# Patient Record
Sex: Female | Born: 1975 | Race: White | Hispanic: No | Marital: Single | Smoking: Current every day smoker
Health system: Southern US, Community
[De-identification: ages and names within clinical notes are randomized; demographics above are authoritative.]

## PROBLEM LIST (undated history)

## (undated) ENCOUNTER — Emergency Department (HOSPITAL_COMMUNITY): Payer: Medicaid Other

## (undated) DIAGNOSIS — M549 Dorsalgia, unspecified: Secondary | ICD-10-CM

## (undated) DIAGNOSIS — F32A Depression, unspecified: Secondary | ICD-10-CM

## (undated) DIAGNOSIS — F329 Major depressive disorder, single episode, unspecified: Secondary | ICD-10-CM

## (undated) DIAGNOSIS — M419 Scoliosis, unspecified: Secondary | ICD-10-CM

## (undated) HISTORY — PX: BACK SURGERY: SHX140

## (undated) HISTORY — PX: CHOLECYSTECTOMY: SHX55

---

## 2010-01-30 ENCOUNTER — Emergency Department (HOSPITAL_BASED_OUTPATIENT_CLINIC_OR_DEPARTMENT_OTHER): Admission: EM | Admit: 2010-01-30 | Discharge: 2010-01-30 | Payer: Self-pay | Admitting: Emergency Medicine

## 2010-06-03 LAB — URINALYSIS, ROUTINE W REFLEX MICROSCOPIC
Glucose, UA: NEGATIVE mg/dL
Ketones, ur: NEGATIVE mg/dL
Protein, ur: NEGATIVE mg/dL
pH: 6.5 (ref 5.0–8.0)

## 2010-06-03 LAB — URINE MICROSCOPIC-ADD ON

## 2011-03-07 ENCOUNTER — Encounter: Payer: Self-pay | Admitting: *Deleted

## 2011-03-07 ENCOUNTER — Emergency Department (HOSPITAL_BASED_OUTPATIENT_CLINIC_OR_DEPARTMENT_OTHER)
Admission: EM | Admit: 2011-03-07 | Discharge: 2011-03-07 | Disposition: A | Discharge status: Home / Self Care | Payer: Self-pay | Attending: Emergency Medicine | Admitting: Emergency Medicine

## 2011-03-07 ENCOUNTER — Emergency Department (INDEPENDENT_AMBULATORY_CARE_PROVIDER_SITE_OTHER): Payer: Self-pay

## 2011-03-07 DIAGNOSIS — R109 Unspecified abdominal pain: Secondary | ICD-10-CM | POA: Insufficient documentation

## 2011-03-07 DIAGNOSIS — R197 Diarrhea, unspecified: Secondary | ICD-10-CM

## 2011-03-07 DIAGNOSIS — R112 Nausea with vomiting, unspecified: Secondary | ICD-10-CM

## 2011-03-07 LAB — URINALYSIS, ROUTINE W REFLEX MICROSCOPIC
Glucose, UA: NEGATIVE mg/dL
Hgb urine dipstick: NEGATIVE
Leukocytes, UA: NEGATIVE
Protein, ur: NEGATIVE mg/dL
Specific Gravity, Urine: 1.006 (ref 1.005–1.030)
Urobilinogen, UA: 0.2 mg/dL (ref 0.0–1.0)

## 2011-03-07 LAB — PREGNANCY, URINE: Preg Test, Ur: NEGATIVE

## 2011-03-07 LAB — WET PREP, GENITAL: Trich, Wet Prep: NONE SEEN

## 2011-03-07 IMAGING — CR DG ABDOMEN ACUTE W/ 1V CHEST
3 series · 3 of 3 positions shown · non-contrast
Comparison: None

CLINICAL DATA: Nausea, vomiting and diarrhea.

ACUTE ABDOMEN SERIES (ABDOMEN 2 VIEW & CHEST 1 VIEW)

[t abdomen supine]
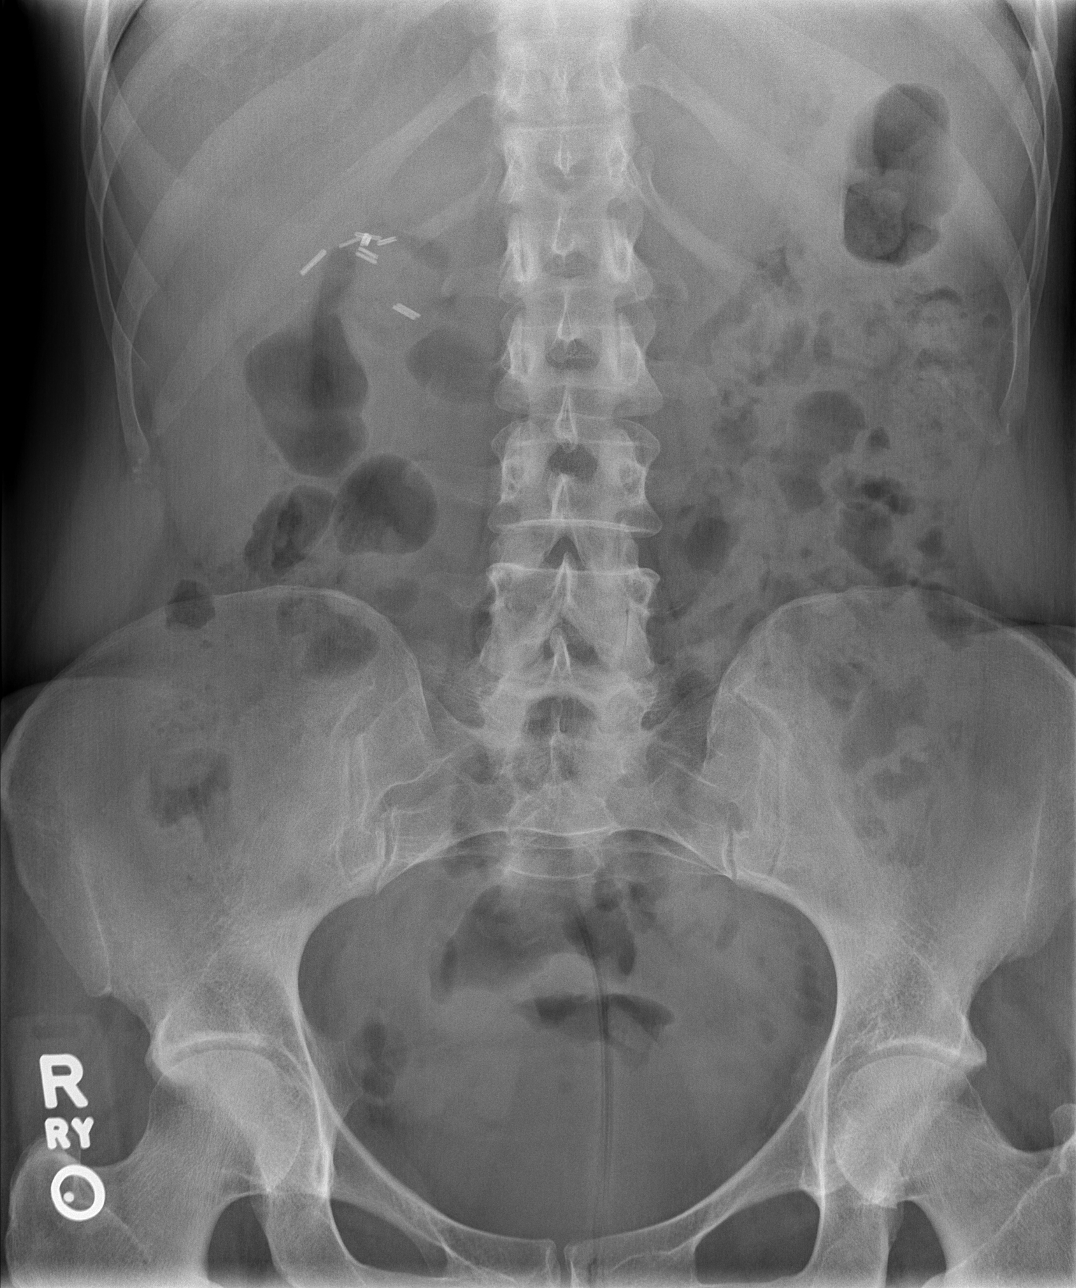

[w abdomen upright]
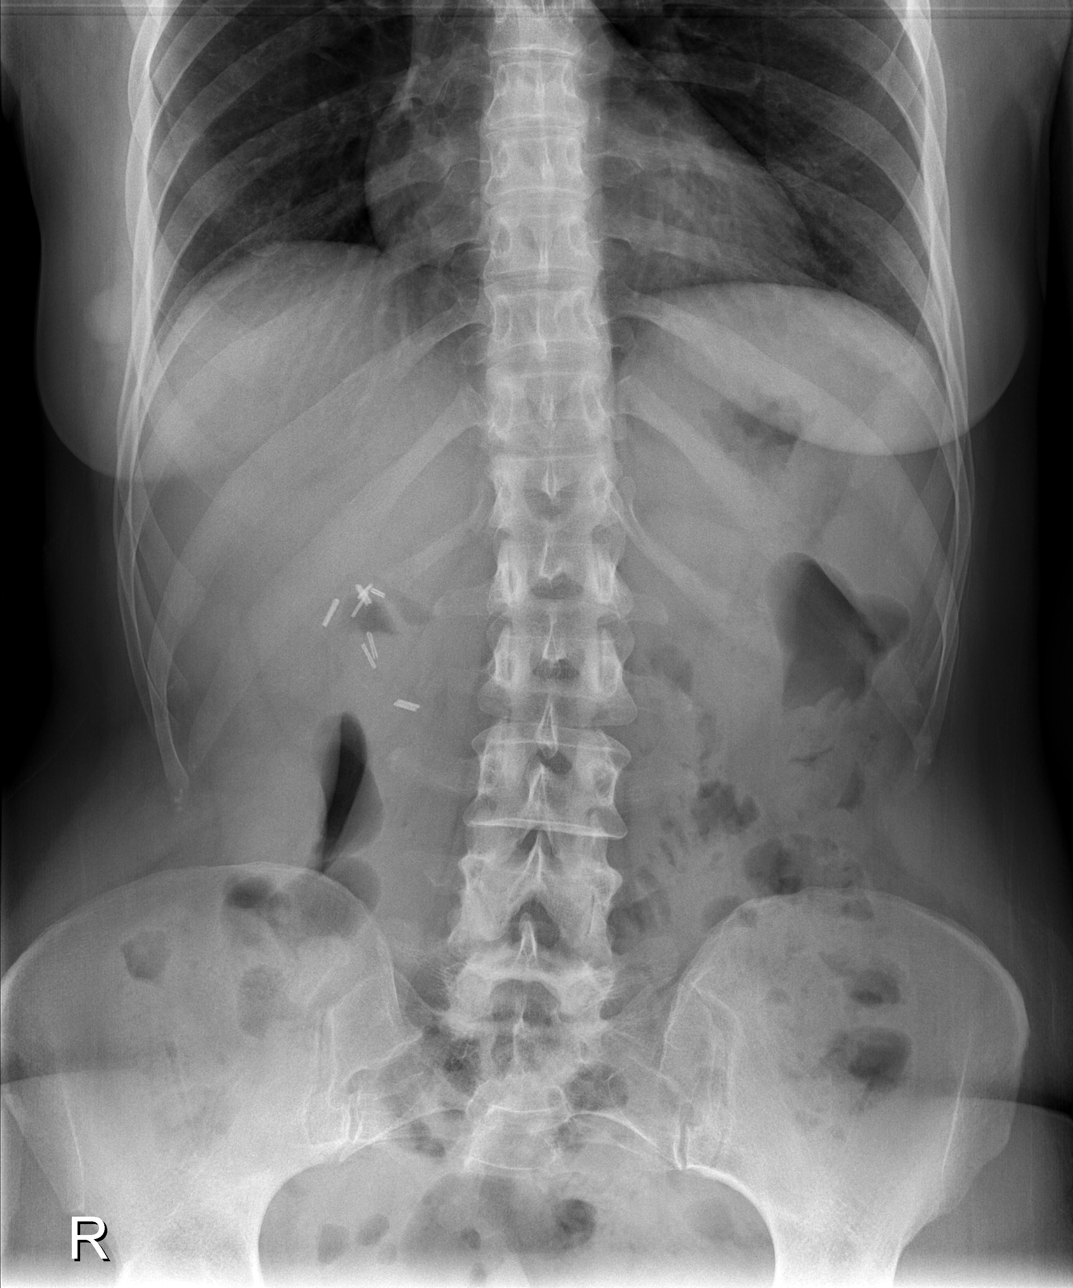

[w chest pa]
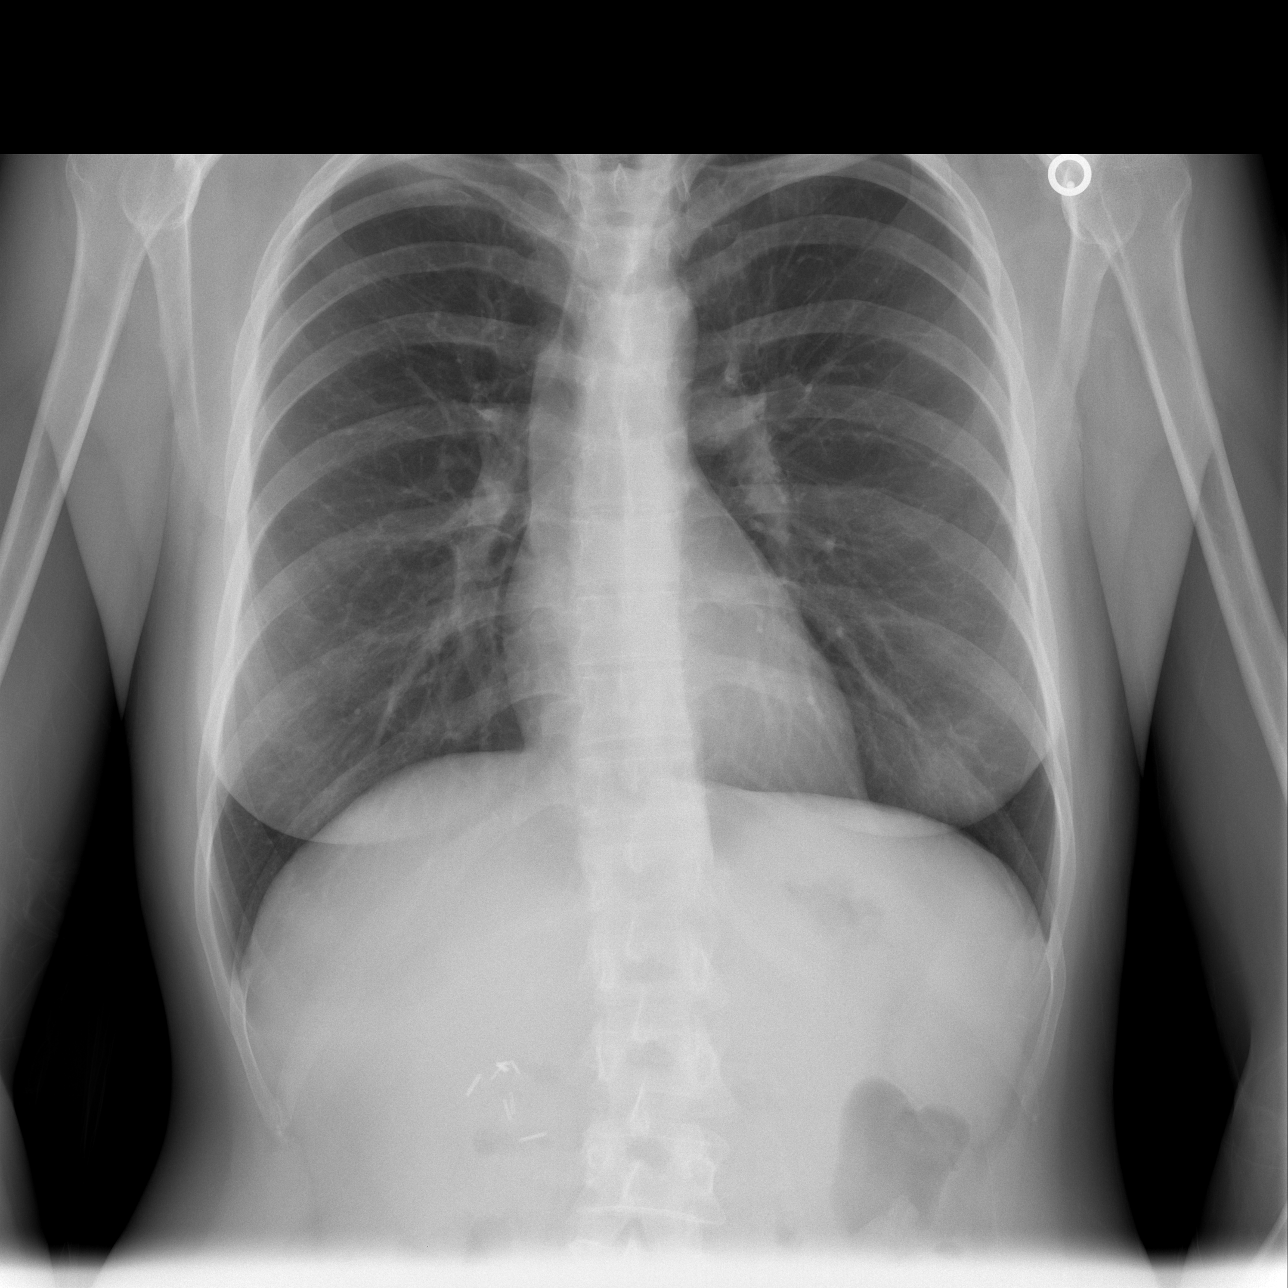

[3 of 3 positions shown; findings below may reference images not displayed]

FINDINGS: The upright chest x-ray is normal.

Two views of the abdomen demonstrate an unremarkable bowel gas
pattern.  No findings for obstruction or perforation.  Surgical
changes in the right upper quadrant from a prior cholecystectomy.
The soft tissue shadows are maintained.  The bony structures are
intact.
IMPRESSION: 1.  No acute cardiopulmonary findings.
2.  No plain film findings for an acute abdominal process.
Nonspecific bowel gas pattern.

## 2011-03-07 MED ORDER — HYDROCODONE-ACETAMINOPHEN 5-500 MG PO TABS: 1.0000 | ORAL_TABLET | Freq: Four times a day (QID) | ORAL | Status: AC | PRN

## 2011-03-07 MED ORDER — DICYCLOMINE HCL 10 MG/ML IM SOLN
20.0000 mg | Freq: Once | INTRAMUSCULAR | Status: AC
  Administered 2011-03-07: 20 mg via INTRAMUSCULAR
  Filled 2011-03-07: qty 2

## 2011-03-07 NOTE — ED Notes (Signed)
Pt states she had a "nightmare surgery" in Sept. When her GB was removed. "They knicked my liver and I have not been right since". Now c/o right side pain around ribs and low abd "burning" x 2 weeks.  Some recurrent nausea and diarrhea.

## 2011-03-07 NOTE — ED Provider Notes (Signed)
Medical screening examination/treatment/procedure(s) were performed by non-physician practitioner and as supervising physician I was immediately available for consultation/collaboration.  Hurman Horn, MD 03/07/11 [phone]

## 2011-03-07 NOTE — ED Provider Notes (Signed)
History     CSN: [id number] Arrival date & time: 03/07/2011  3:20 PM   First MD Initiated Contact with Patient 03/07/11 1523      Chief Complaint  Patient presents with  . Abdominal Pain    (Consider location/radiation/quality/duration/timing/severity/associated sxs/prior treatment) HPI Comments: Pt states that he has a history of a bad surgery last year and she has been having problems since:pt states that she recently got off all her medications but then she developed and burning sensation in her lower abdomen and diarrhea:pt states that she has had 2-3 episodes of diarrhea daily:no recent travel or abx  Patient is a 35 y.o. female presenting with abdominal pain. The history is provided by the patient. No language interpreter was used.  Abdominal Pain The primary symptoms of the illness include abdominal pain, diarrhea, dysuria and vaginal discharge. The primary symptoms of the illness do not include vomiting. The current episode started more than 2 days ago. The onset of the illness was gradual. The problem has not changed since onset. The vaginal discharge is associated with dysuria.   The patient states that she believes she is currently not pregnant. The patient has not had a change in bowel habit.    History reviewed. No pertinent past medical history.  Past Surgical History  Procedure Date  . Cholecystectomy     History reviewed. No pertinent family history.  History  Substance Use Topics  . Smoking status: Current Everyday Smoker  . Smokeless tobacco: Not on file  . Alcohol Use: No    OB History    Grav Para Term Preterm Abortions TAB SAB Ect Mult Living                  Review of Systems  Gastrointestinal: Positive for abdominal pain and diarrhea. Negative for vomiting.  Genitourinary: Positive for dysuria and vaginal discharge.  All other systems reviewed and are negative.    Allergies  Penicillins and Rocephin  Home Medications   Current  Outpatient Rx  Name Route Sig Dispense Refill  . PROMETHAZINE HCL 25 MG PO TABS Oral Take 25 mg by mouth every 6 (six) hours as needed. For nausea        BP 115/80  Pulse 100  Temp(Src) 97.9 F (36.6 C) (Oral)  Resp 20  Ht 5\' 3"  (1.6 m)  Wt 130 lb (58.968 kg)  BMI 23.03 kg/m2  SpO2 100%  LMP 03/02/2011  Physical Exam  Nursing note and vitals reviewed. Constitutional: She is oriented to person, place, and time. She appears well-developed and well-nourished.  HENT:  Head: Normocephalic.  Cardiovascular: Normal rate and regular rhythm.   Pulmonary/Chest: Effort normal and breath sounds normal.  Abdominal: Soft. Bowel sounds are normal.       Pt has tenderness noted on exam  Genitourinary: Cervix exhibits no motion tenderness.  Musculoskeletal: Normal range of motion.  Neurological: She is alert and oriented to person, place, and time.  Skin: Skin is warm and dry.  Psychiatric: She has a normal mood and affect.    ED Course  Procedures (including critical care time)  Labs Reviewed  WET PREP, GENITAL - Abnormal; Notable for the following:    Clue Cells, Wet Prep FEW (*)    WBC, Wet Prep HPF POC FEW (*)    All other components within normal limits  URINALYSIS, ROUTINE W REFLEX MICROSCOPIC  PREGNANCY, URINE  GC/CHLAMYDIA PROBE AMP, GENITAL   Dg Abd Acute W/chest  03/07/2011  *RADIOLOGY REPORT*  Clinical  Data: Nausea, vomiting and diarrhea.  ACUTE ABDOMEN SERIES (ABDOMEN 2 VIEW & CHEST 1 VIEW)  Comparison: None  Findings: The upright chest x-ray is normal.  Two views of the abdomen demonstrate an unremarkable bowel gas pattern.  No findings for obstruction or perforation.  Surgical changes in the right upper quadrant from a prior cholecystectomy. The soft tissue shadows are maintained.  The bony structures are intact.  IMPRESSION:  1.  No acute cardiopulmonary findings. 2.  No plain film findings for an acute abdominal process. Nonspecific bowel gas pattern.  Original Report  Authenticated By: P. Loralie Champagne, M.D.     1. Abdominal pain   2. Diarrhea       MDM  abd non surgical on exam:pt is having problem with food tolerance:discussed with pt that with having had the symptoms this long it is probably worth follow up with gi        Teressa Lower, NP 03/07/11 1715

## 2011-03-09 LAB — GC/CHLAMYDIA PROBE AMP, GENITAL: GC Probe Amp, Genital: NEGATIVE

## 2012-12-31 ENCOUNTER — Emergency Department (HOSPITAL_BASED_OUTPATIENT_CLINIC_OR_DEPARTMENT_OTHER)
Admission: EM | Admit: 2012-12-31 | Discharge: 2012-12-31 | Disposition: A | Discharge status: Home / Self Care | Payer: Self-pay | Attending: Emergency Medicine | Admitting: Emergency Medicine

## 2012-12-31 ENCOUNTER — Encounter (HOSPITAL_BASED_OUTPATIENT_CLINIC_OR_DEPARTMENT_OTHER): Payer: Self-pay | Admitting: Emergency Medicine

## 2012-12-31 DIAGNOSIS — F17200 Nicotine dependence, unspecified, uncomplicated: Secondary | ICD-10-CM | POA: Insufficient documentation

## 2012-12-31 DIAGNOSIS — Y939 Activity, unspecified: Secondary | ICD-10-CM | POA: Insufficient documentation

## 2012-12-31 DIAGNOSIS — Y929 Unspecified place or not applicable: Secondary | ICD-10-CM | POA: Insufficient documentation

## 2012-12-31 DIAGNOSIS — Z880 Allergy status to penicillin: Secondary | ICD-10-CM | POA: Insufficient documentation

## 2012-12-31 DIAGNOSIS — L02214 Cutaneous abscess of groin: Secondary | ICD-10-CM

## 2012-12-31 DIAGNOSIS — L089 Local infection of the skin and subcutaneous tissue, unspecified: Secondary | ICD-10-CM | POA: Insufficient documentation

## 2012-12-31 DIAGNOSIS — L02219 Cutaneous abscess of trunk, unspecified: Secondary | ICD-10-CM | POA: Insufficient documentation

## 2012-12-31 NOTE — ED Provider Notes (Signed)
CSN: [id number]     Arrival date & time 12/31/12  0740 History   First MD Initiated Contact with Patient 12/31/12 [phone]     Chief Complaint  Patient presents with  . Insect Bite   (Consider location/radiation/quality/duration/timing/severity/associated sxs/prior Treatment) HPI Pt reprots she woke up this morning with itching all over and a sharp pain in her R inguinal area. She found a large brown spider in her bed and is concerned about brown recluse bite. Denies fever or drainage.   No past medical history on file. Past Surgical History  Procedure Laterality Date  . Cholecystectomy     No family history on file. History  Substance Use Topics  . Smoking status: Current Every Day Smoker  . Smokeless tobacco: Not on file  . Alcohol Use: No   OB History   Grav Para Term Preterm Abortions TAB SAB Ect Mult Living                 Review of Systems All other systems reviewed and are negative except as noted in HPI.   Allergies  Penicillins and Rocephin  Home Medications  No current outpatient prescriptions on file. BP 110/72  Pulse 100  Temp(Src) 98.6 F (37 C) (Oral)  Resp 16  Ht 5\' 3"  (1.6 m)  Wt 144 lb (65.318 kg)  BMI 25.51 kg/m2  SpO2 98%  LMP 12/10/2012 Physical Exam  Nursing note and vitals reviewed. Constitutional: She is oriented to person, place, and time. She appears well-developed and well-nourished.  HENT:  Head: Normocephalic and atraumatic.  Eyes: EOM are normal. Pupils are equal, round, and reactive to light.  Neck: Normal range of motion. Neck supple.  Cardiovascular: Normal rate, normal heart sounds and intact distal pulses.   Pulmonary/Chest: Effort normal and breath sounds normal.  Abdominal: Bowel sounds are normal. She exhibits no distension. There is no tenderness.  Genitourinary:  Small 1cm area of fluctuance and tenderness with central ecchymosis in R inguinal area  Musculoskeletal: Normal range of motion. She exhibits no edema and no  tenderness.  Neurological: She is alert and oriented to person, place, and time. She has normal strength. No cranial nerve deficit or sensory deficit.  Skin: Skin is warm and dry. No rash noted.  Psychiatric: She has a normal mood and affect.    ED Course  Procedures (including critical care time) INCISION AND DRAINAGE Performed by: Pollyann Savoy. Consent: Verbal consent obtained. Risks and benefits: risks, benefits and alternatives were discussed Time out performed prior to procedure Type: abscess Body area: R groin Anesthesia: local infiltration Incision was made with a scalpel. Local anesthetic: lidocaine 1% no epinephrine Anesthetic total: 1 ml Complexity: simple Drainage: purulent Drainage amount: small Packing material: none Patient tolerance: Patient tolerated the procedure well with no immediate complications.     Labs Review Labs Reviewed - No data to display Imaging Review No results found.  EKG Interpretation   None       MDM   1. Abscess of groin, right     Suspect small abscess from ingrown hair as opposed to brown recluse bite. Pt and daughter describe a brown spider with black spots much larger than typical for brown recluse.     Tambi Thole B. Bernette Mayers, MD 12/31/12 [phone]

## 2012-12-31 NOTE — ED Notes (Signed)
MD at bedside. 

## 2012-12-31 NOTE — ED Notes (Signed)
Pt states she was bitten by brown recluse this am on right groin.  Pt states it is painful, itching.

## 2013-07-02 ENCOUNTER — Encounter (HOSPITAL_BASED_OUTPATIENT_CLINIC_OR_DEPARTMENT_OTHER): Payer: Self-pay | Admitting: Emergency Medicine

## 2013-07-02 ENCOUNTER — Emergency Department (HOSPITAL_BASED_OUTPATIENT_CLINIC_OR_DEPARTMENT_OTHER)
Admission: EM | Admit: 2013-07-02 | Discharge: 2013-07-02 | Disposition: A | Discharge status: Home / Self Care | Payer: Worker's Compensation | Attending: Emergency Medicine | Admitting: Emergency Medicine

## 2013-07-02 DIAGNOSIS — Y9389 Activity, other specified: Secondary | ICD-10-CM | POA: Insufficient documentation

## 2013-07-02 DIAGNOSIS — T23279A Burn of second degree of unspecified wrist, initial encounter: Secondary | ICD-10-CM | POA: Insufficient documentation

## 2013-07-02 DIAGNOSIS — F17200 Nicotine dependence, unspecified, uncomplicated: Secondary | ICD-10-CM | POA: Insufficient documentation

## 2013-07-02 DIAGNOSIS — X131XXA Other contact with steam and other hot vapors, initial encounter: Secondary | ICD-10-CM

## 2013-07-02 DIAGNOSIS — Y9289 Other specified places as the place of occurrence of the external cause: Secondary | ICD-10-CM | POA: Insufficient documentation

## 2013-07-02 DIAGNOSIS — T23072A Burn of unspecified degree of left wrist, initial encounter: Secondary | ICD-10-CM

## 2013-07-02 DIAGNOSIS — Z880 Allergy status to penicillin: Secondary | ICD-10-CM | POA: Insufficient documentation

## 2013-07-02 DIAGNOSIS — X12XXXA Contact with other hot fluids, initial encounter: Secondary | ICD-10-CM | POA: Insufficient documentation

## 2013-07-02 MED ORDER — OXYCODONE-ACETAMINOPHEN 5-325 MG PO TABS: 1.0000 | ORAL_TABLET | ORAL | Status: DC | PRN

## 2013-07-02 MED ORDER — OXYCODONE-ACETAMINOPHEN 5-325 MG PO TABS
1.0000 | ORAL_TABLET | Freq: Once | ORAL | Status: AC
  Administered 2013-07-02: 1 via ORAL
  Filled 2013-07-02: qty 1

## 2013-07-02 MED ORDER — SILVER SULFADIAZINE 1 % EX CREA: 1.0000 "application " | TOPICAL_CREAM | Freq: Every day | CUTANEOUS | Status: DC

## 2013-07-02 NOTE — ED Notes (Signed)
Sustained coffee burn to the inside of her left wrist.

## 2013-07-02 NOTE — ED Provider Notes (Signed)
Medical screening examination/treatment/procedure(s) were conducted as a shared visit with non-physician practitioner(s) and myself.  I personally evaluated the patient during the encounter.   EKG Interpretation None       1st degree burn around R wrist, not circumferential - due to hot coffee. One spot of 2nd degree. No concern for compartment syndrome - will give f/u with hand and instruct to use neosporin  Dagmar HaitWilliam Dinna Severs, MD 07/02/13 1537

## 2013-07-02 NOTE — ED Provider Notes (Signed)
CSN: [id number]     Arrival date & time 07/02/13  1408 History   First MD Initiated Contact with Patient 07/02/13 1428     Chief Complaint  Patient presents with  . Burn     (Consider location/radiation/quality/duration/timing/severity/associated sxs/prior Treatment) Patient is a 38 y.o. female presenting with burn. The history is provided by the patient. No language interpreter was used.  Burn Burn location:  Shoulder/arm Shoulder/arm burn location:  L wrist Burn quality:  Intact blister and red Mechanism of burn:  Hot liquid Associated symptoms comment:  Burn to left wrist after a coffee spill. Burn affects volar and dorsal wrist extending to dorsum of hand. Fingers spared. No other injury.   History reviewed. No pertinent past medical history. Past Surgical History  Procedure Laterality Date  . Cholecystectomy     No family history on file. History  Substance Use Topics  . Smoking status: Current Every Day Smoker  . Smokeless tobacco: Not on file  . Alcohol Use: No   OB History   Grav Para Term Preterm Abortions TAB SAB Ect Mult Living                 Review of Systems  Constitutional: Negative for fever.  Skin: Positive for wound.      Allergies  Penicillins and Rocephin  Home Medications  No current outpatient prescriptions on file. BP 121/76  Pulse 99  Temp(Src) 98.3 F (36.8 C) (Oral)  Resp 18  Ht 5\' 3"  (1.6 m)  Wt 140 lb (63.504 kg)  BMI 24.81 kg/m2  SpO2 98%  LMP 06/25/2013 Physical Exam  Constitutional: She appears well-developed and well-nourished. No distress.  Psychiatric:  Largely 1st degree burn to left wrist, greatest over volar/ulnar aspect with two small intact blisters of 2nd degree burn. 1st degree extends around wrist circumferentially with scattered, lesser burn surface lateral ulnar aspect. FROM.     ED Course  Procedures (including critical care time) Labs Review Labs Reviewed - No data to display Imaging Review No results  found.   EKG Interpretation None      MDM   Final diagnoses:  None    1. Mixed 1st and 2nd degree burn left wrist  Burn care to left wrist. Refer to hand for follow up recheck     Arnoldo HookerShari A Beyla Loney, PA-C 07/02/13 1529

## 2013-07-02 NOTE — Discharge Instructions (Signed)

## 2013-07-05 ENCOUNTER — Emergency Department (HOSPITAL_BASED_OUTPATIENT_CLINIC_OR_DEPARTMENT_OTHER)
Admission: EM | Admit: 2013-07-05 | Discharge: 2013-07-05 | Disposition: A | Discharge status: Home / Self Care | Payer: Worker's Compensation | Attending: Emergency Medicine | Admitting: Emergency Medicine

## 2013-07-05 ENCOUNTER — Encounter (HOSPITAL_BASED_OUTPATIENT_CLINIC_OR_DEPARTMENT_OTHER): Payer: Self-pay | Admitting: Emergency Medicine

## 2013-07-05 DIAGNOSIS — Z880 Allergy status to penicillin: Secondary | ICD-10-CM | POA: Insufficient documentation

## 2013-07-05 DIAGNOSIS — Y990 Civilian activity done for income or pay: Secondary | ICD-10-CM | POA: Insufficient documentation

## 2013-07-05 DIAGNOSIS — X12XXXA Contact with other hot fluids, initial encounter: Secondary | ICD-10-CM | POA: Insufficient documentation

## 2013-07-05 DIAGNOSIS — Y9289 Other specified places as the place of occurrence of the external cause: Secondary | ICD-10-CM | POA: Insufficient documentation

## 2013-07-05 DIAGNOSIS — F17200 Nicotine dependence, unspecified, uncomplicated: Secondary | ICD-10-CM | POA: Insufficient documentation

## 2013-07-05 DIAGNOSIS — X131XXA Other contact with steam and other hot vapors, initial encounter: Secondary | ICD-10-CM

## 2013-07-05 DIAGNOSIS — Z79899 Other long term (current) drug therapy: Secondary | ICD-10-CM | POA: Insufficient documentation

## 2013-07-05 DIAGNOSIS — Y9389 Activity, other specified: Secondary | ICD-10-CM | POA: Insufficient documentation

## 2013-07-05 DIAGNOSIS — T23172A Burn of first degree of left wrist, initial encounter: Secondary | ICD-10-CM

## 2013-07-05 DIAGNOSIS — T23179A Burn of first degree of unspecified wrist, initial encounter: Secondary | ICD-10-CM | POA: Insufficient documentation

## 2013-07-05 NOTE — Discharge Instructions (Signed)

## 2013-07-05 NOTE — ED Provider Notes (Signed)
Medical screening examination/treatment/procedure(s) were performed by non-physician practitioner and as supervising physician I was immediately available for consultation/collaboration.   EKG Interpretation None        [place] Ndiaye W Arfa Lamarca, MD 07/05/13 1417 

## 2013-07-05 NOTE — ED Provider Notes (Signed)
CSN: [id number]     Arrival date & time 07/05/13  1146 History   First MD Initiated Contact with Patient 07/05/13 1204     Chief Complaint  Patient presents with  . Arm Pain     (Consider location/radiation/quality/duration/timing/severity/associated sxs/prior Treatment) Patient is a 38 y.o. female presenting with arm pain. The history is provided by the patient. No language interpreter was used.  Arm Pain This is a new problem. The problem occurs constantly. The problem has been unchanged. Associated symptoms include myalgias. Nothing aggravates the symptoms. She has tried nothing for the symptoms. The treatment provided no relief.  Pt complains of burn to her wrist.   Pain goes down into her thumb and shoots up her arm Pt burned arm 4 days ago with hot coffee  History reviewed. No pertinent past medical history. Past Surgical History  Procedure Laterality Date  . Cholecystectomy     No family history on file. History  Substance Use Topics  . Smoking status: Current Every Day Smoker  . Smokeless tobacco: Not on file  . Alcohol Use: No   OB History   Grav Para Term Preterm Abortions TAB SAB Ect Mult Living                 Review of Systems  Musculoskeletal: Positive for myalgias.  All other systems reviewed and are negative.     Allergies  Penicillins and Rocephin  Home Medications   Prior to Admission medications   Medication Sig Start Date End Date Taking? Authorizing Provider  oxyCODONE-acetaminophen (PERCOCET/ROXICET) 5-325 MG per tablet Take 1-2 tablets by mouth every 4 (four) hours as needed for severe pain. 07/02/13   Shari A Upstill, PA-C  silver sulfADIAZINE (SILVADENE) 1 % cream Apply 1 application topically daily. 07/02/13   Shari A Upstill, PA-C   BP 125/66  Pulse 84  Temp(Src) 98.5 F (36.9 C) (Oral)  Resp 18  Ht 5\' 3"  (1.6 m)  Wt 140 lb (63.504 kg)  BMI 24.81 kg/m2  SpO2 100%  LMP 06/25/2013 Physical Exam  Nursing note and vitals  reviewed. Constitutional: She is oriented to person, place, and time. She appears well-developed and well-nourished.  Musculoskeletal: She exhibits tenderness.  Dark healing burn palmar wist with some dorsal extension  Neurological: She is alert and oriented to person, place, and time. She has normal reflexes.  Skin: Skin is warm.  Psychiatric: She has a normal mood and affect.    ED Course  Procedures (including critical care time) Labs Review Labs Reviewed - No data to display  Imaging Review No results found.   EKG Interpretation None      MDM   Final diagnoses:  Burn of wrist, left, first degree    I advised pt she needs to see Hand for evaluation.    Area appears to be healing.   Lonia SkinnerLeslie K [place] Whittier-Los Nietos[place], PA-C 07/05/13 1230

## 2013-07-05 NOTE — ED Notes (Addendum)
Patient was seen Sunday after spilling hot coffee on her left wrist at work. She came in today because she feels the pain is worsening, there is a pain that runs down her hand and up into her forearm. She has been using silvadene cream daily.

## 2013-11-09 ENCOUNTER — Emergency Department (HOSPITAL_BASED_OUTPATIENT_CLINIC_OR_DEPARTMENT_OTHER)
Admission: EM | Admit: 2013-11-09 | Discharge: 2013-11-09 | Disposition: A | Discharge status: Home / Self Care | Payer: Self-pay | Attending: Emergency Medicine | Admitting: Emergency Medicine

## 2013-11-09 ENCOUNTER — Encounter (HOSPITAL_BASED_OUTPATIENT_CLINIC_OR_DEPARTMENT_OTHER): Payer: Self-pay | Admitting: Emergency Medicine

## 2013-11-09 ENCOUNTER — Emergency Department (HOSPITAL_BASED_OUTPATIENT_CLINIC_OR_DEPARTMENT_OTHER): Payer: Self-pay

## 2013-11-09 DIAGNOSIS — Z791 Long term (current) use of non-steroidal anti-inflammatories (NSAID): Secondary | ICD-10-CM | POA: Insufficient documentation

## 2013-11-09 DIAGNOSIS — S46909A Unspecified injury of unspecified muscle, fascia and tendon at shoulder and upper arm level, unspecified arm, initial encounter: Secondary | ICD-10-CM | POA: Insufficient documentation

## 2013-11-09 DIAGNOSIS — Y9289 Other specified places as the place of occurrence of the external cause: Secondary | ICD-10-CM | POA: Insufficient documentation

## 2013-11-09 DIAGNOSIS — M25511 Pain in right shoulder: Secondary | ICD-10-CM

## 2013-11-09 DIAGNOSIS — Y9352 Activity, horseback riding: Secondary | ICD-10-CM | POA: Insufficient documentation

## 2013-11-09 DIAGNOSIS — Z880 Allergy status to penicillin: Secondary | ICD-10-CM | POA: Insufficient documentation

## 2013-11-09 DIAGNOSIS — S4980XA Other specified injuries of shoulder and upper arm, unspecified arm, initial encounter: Secondary | ICD-10-CM | POA: Insufficient documentation

## 2013-11-09 DIAGNOSIS — Z792 Long term (current) use of antibiotics: Secondary | ICD-10-CM | POA: Insufficient documentation

## 2013-11-09 DIAGNOSIS — F17200 Nicotine dependence, unspecified, uncomplicated: Secondary | ICD-10-CM | POA: Insufficient documentation

## 2013-11-09 IMAGING — CR DG ELBOW COMPLETE 3+V*R*
4 series · 4 of 4 positions shown · non-contrast
Comparison: None.

CLINICAL DATA: SHOULDER PAIN

EXAM:
RIGHT ELBOW - COMPLETE 3+ VIEW

[x elbow joint ap right]
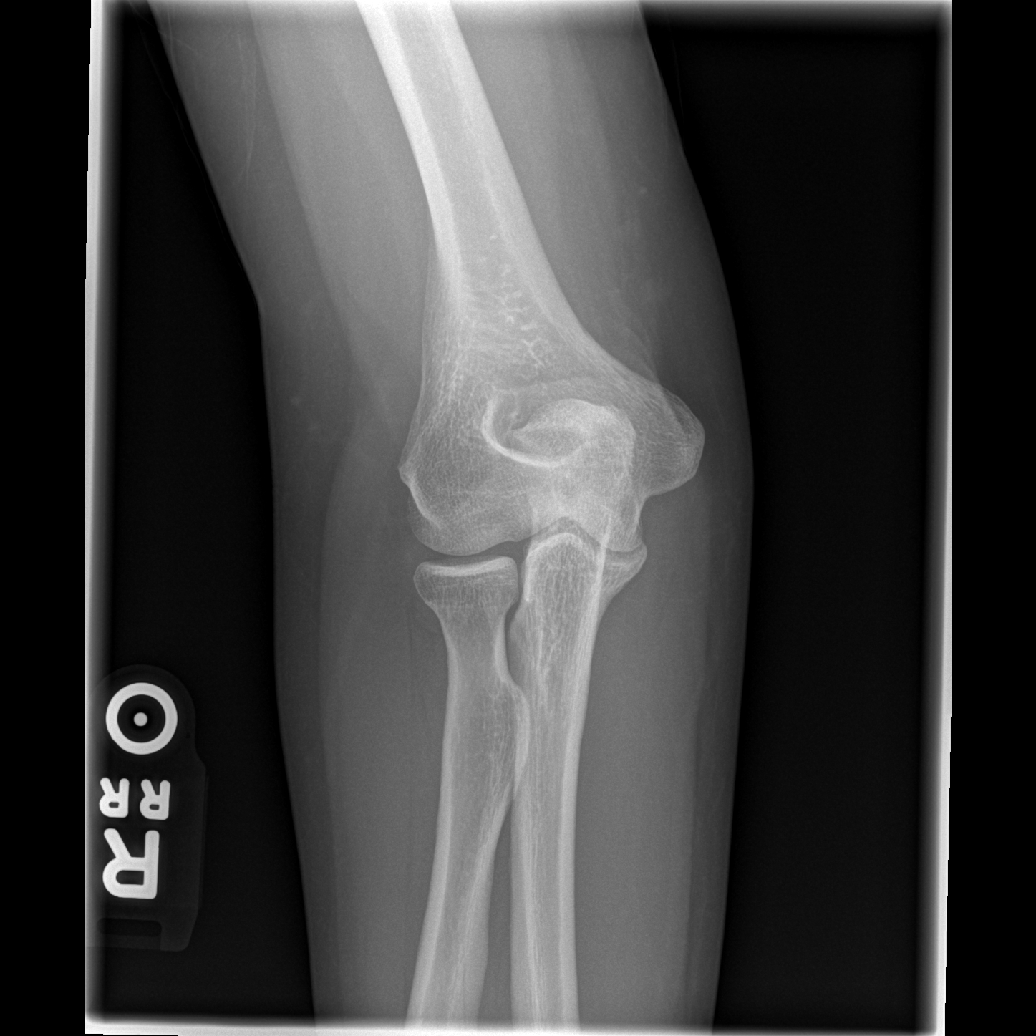

[x elbow joint obl. right (1 of 2)]
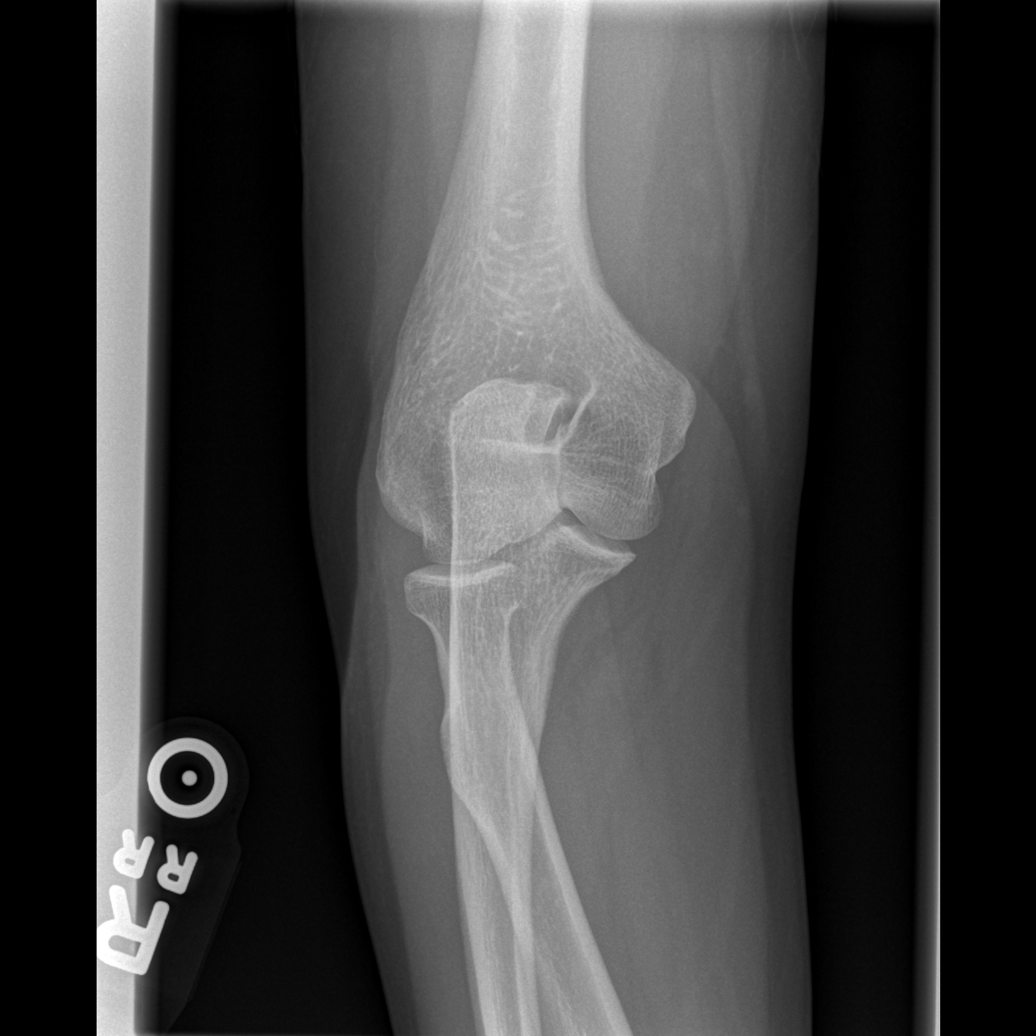

[x elbow joint obl. right (2 of 2)]
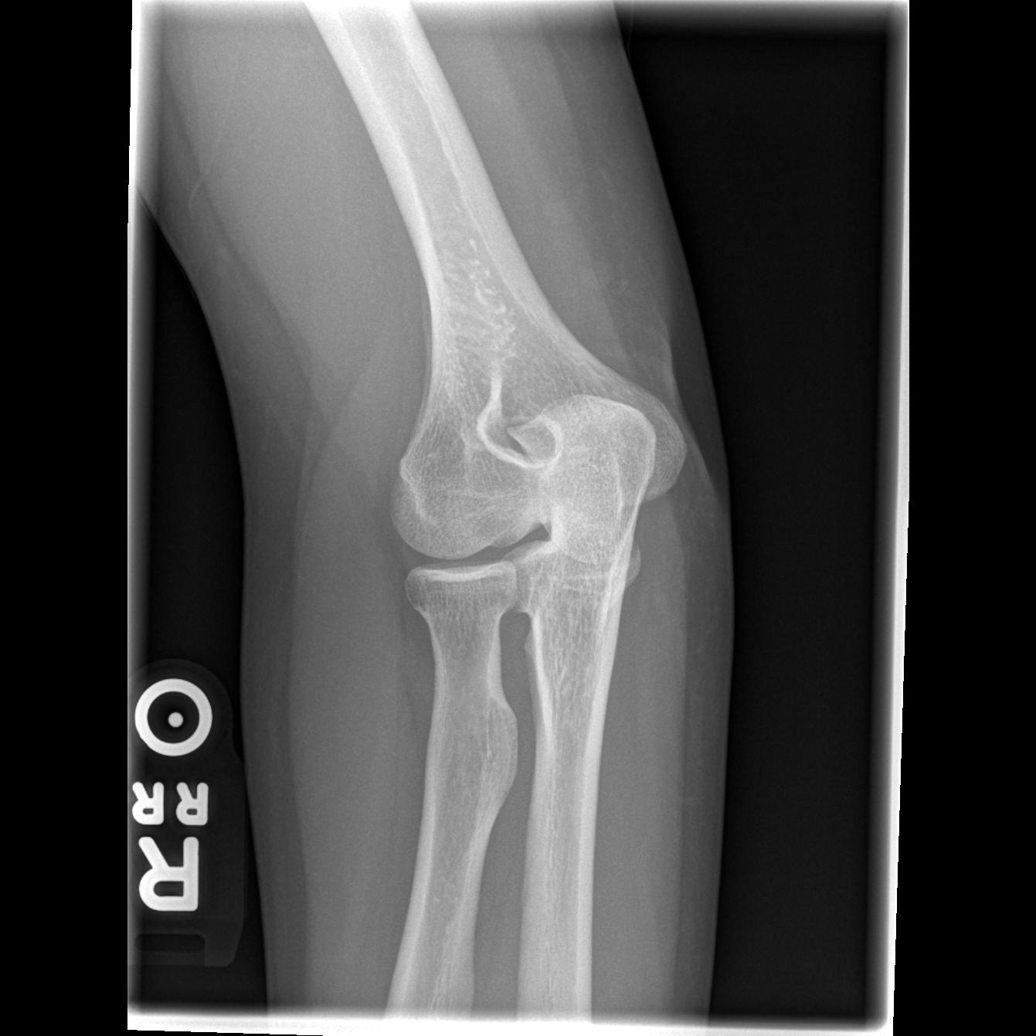

[view not recorded]
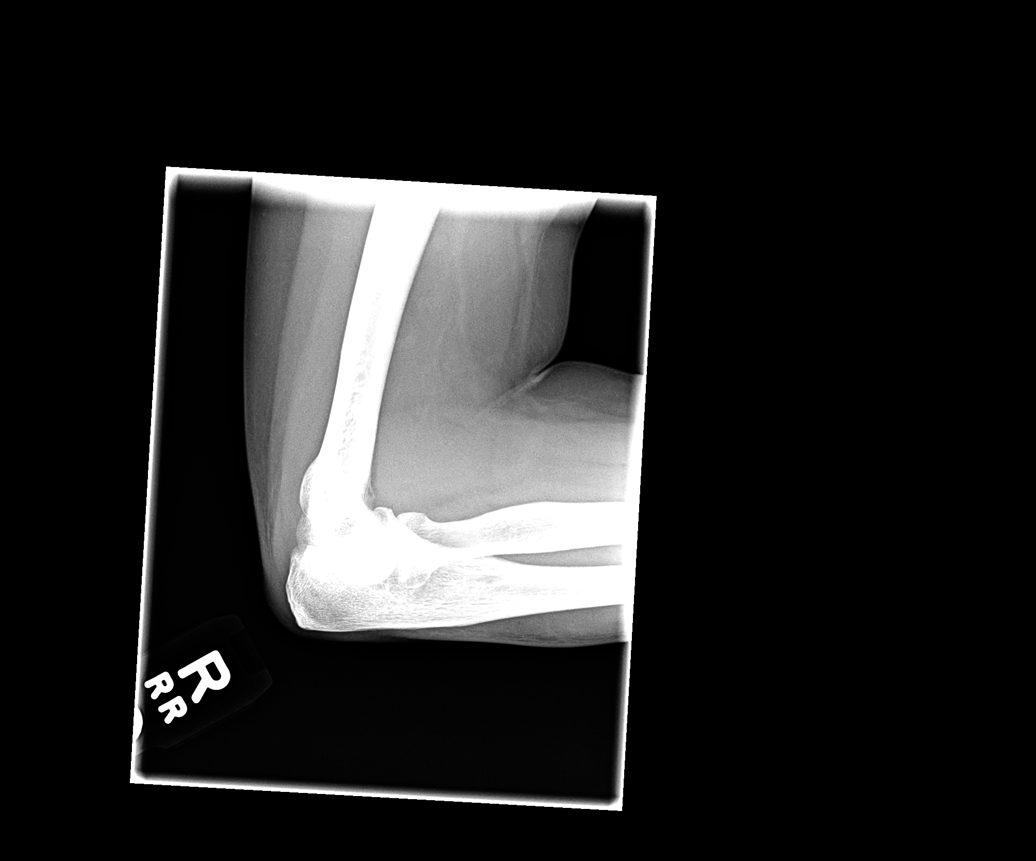

[4 of 4 positions shown; findings below may reference images not displayed]

FINDINGS: There is no evidence of fracture, dislocation, or joint effusion.
There is no evidence of arthropathy or other focal bone abnormality.
Soft tissues are unremarkable.
IMPRESSION: Negative.

## 2013-11-09 IMAGING — CR DG SHOULDER 2+V*R*
2 series · 2 of 2 positions shown · non-contrast
Comparison: None.

CLINICAL DATA: SHOULDER PAIN

EXAM:
RIGHT SHOULDER - 2+ VIEW

[w shoulder ap internal righ]
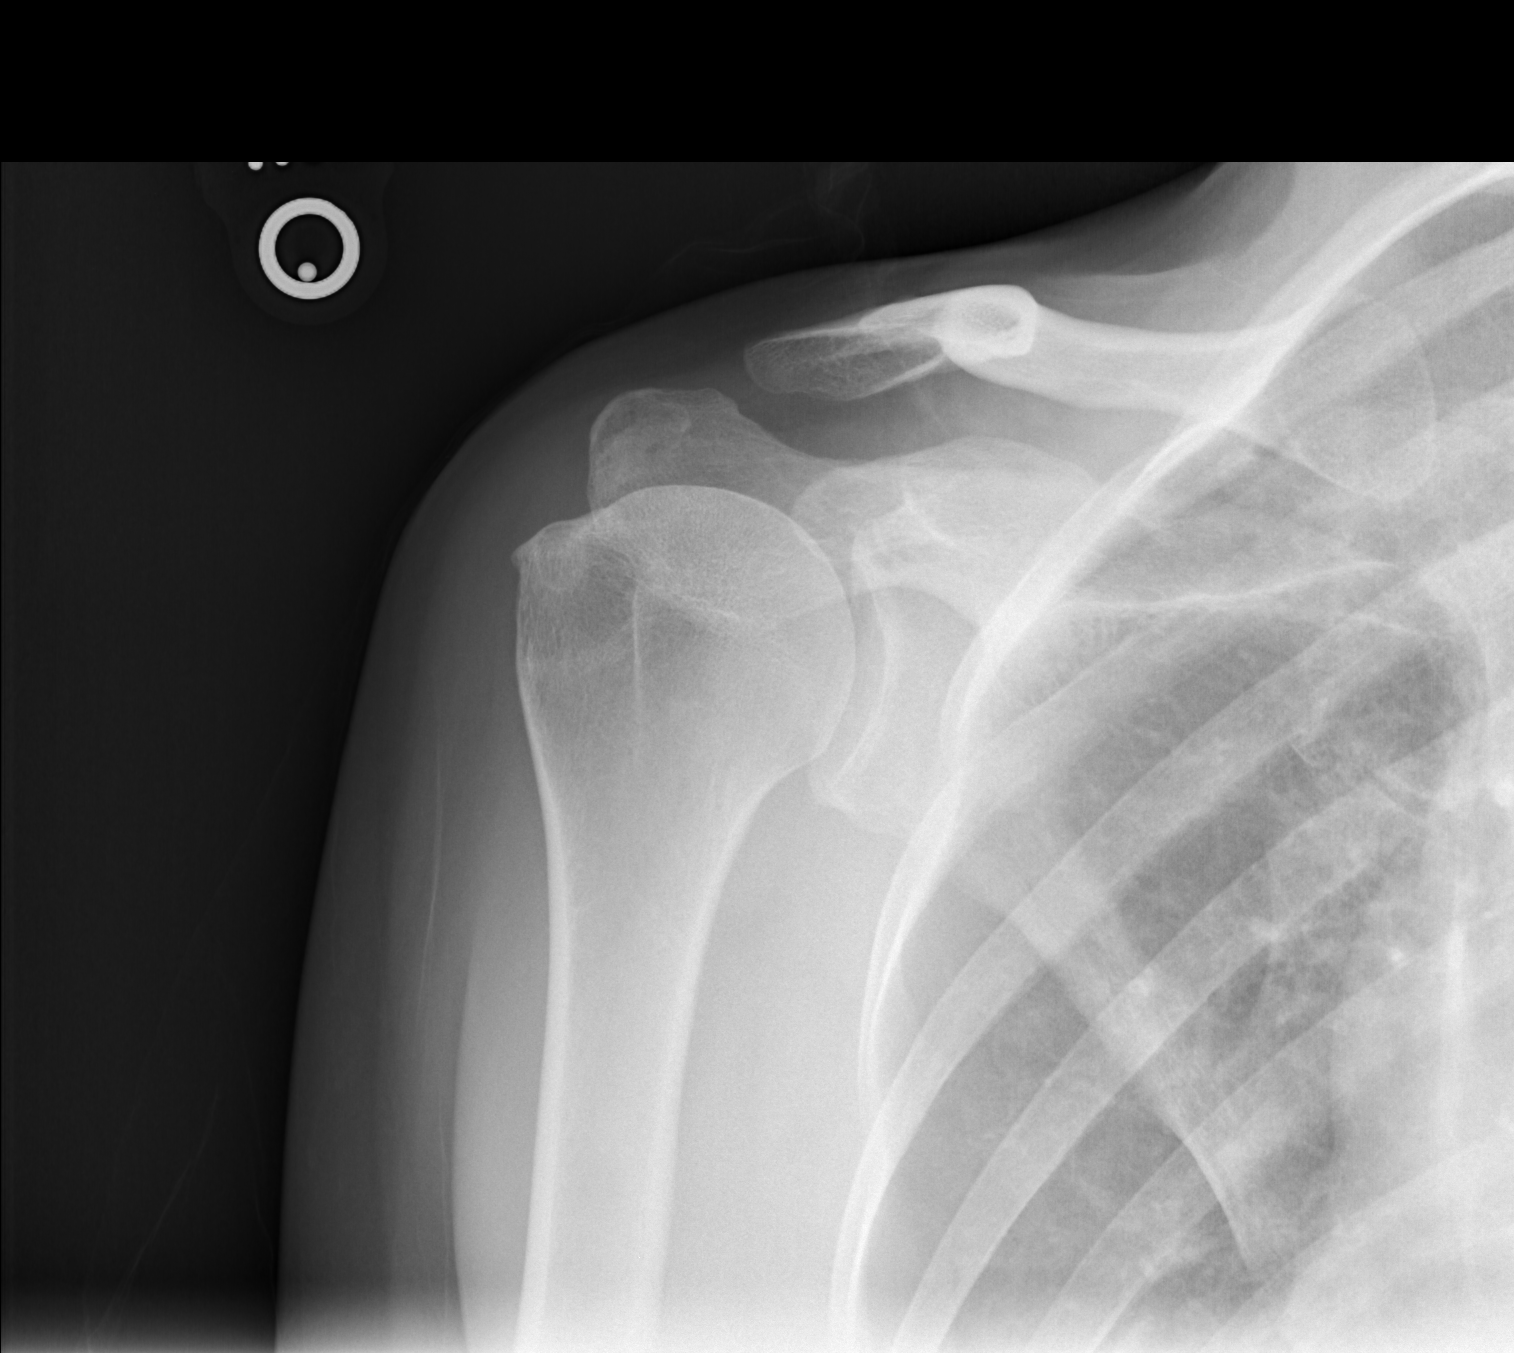

[x shoulder axillary right]
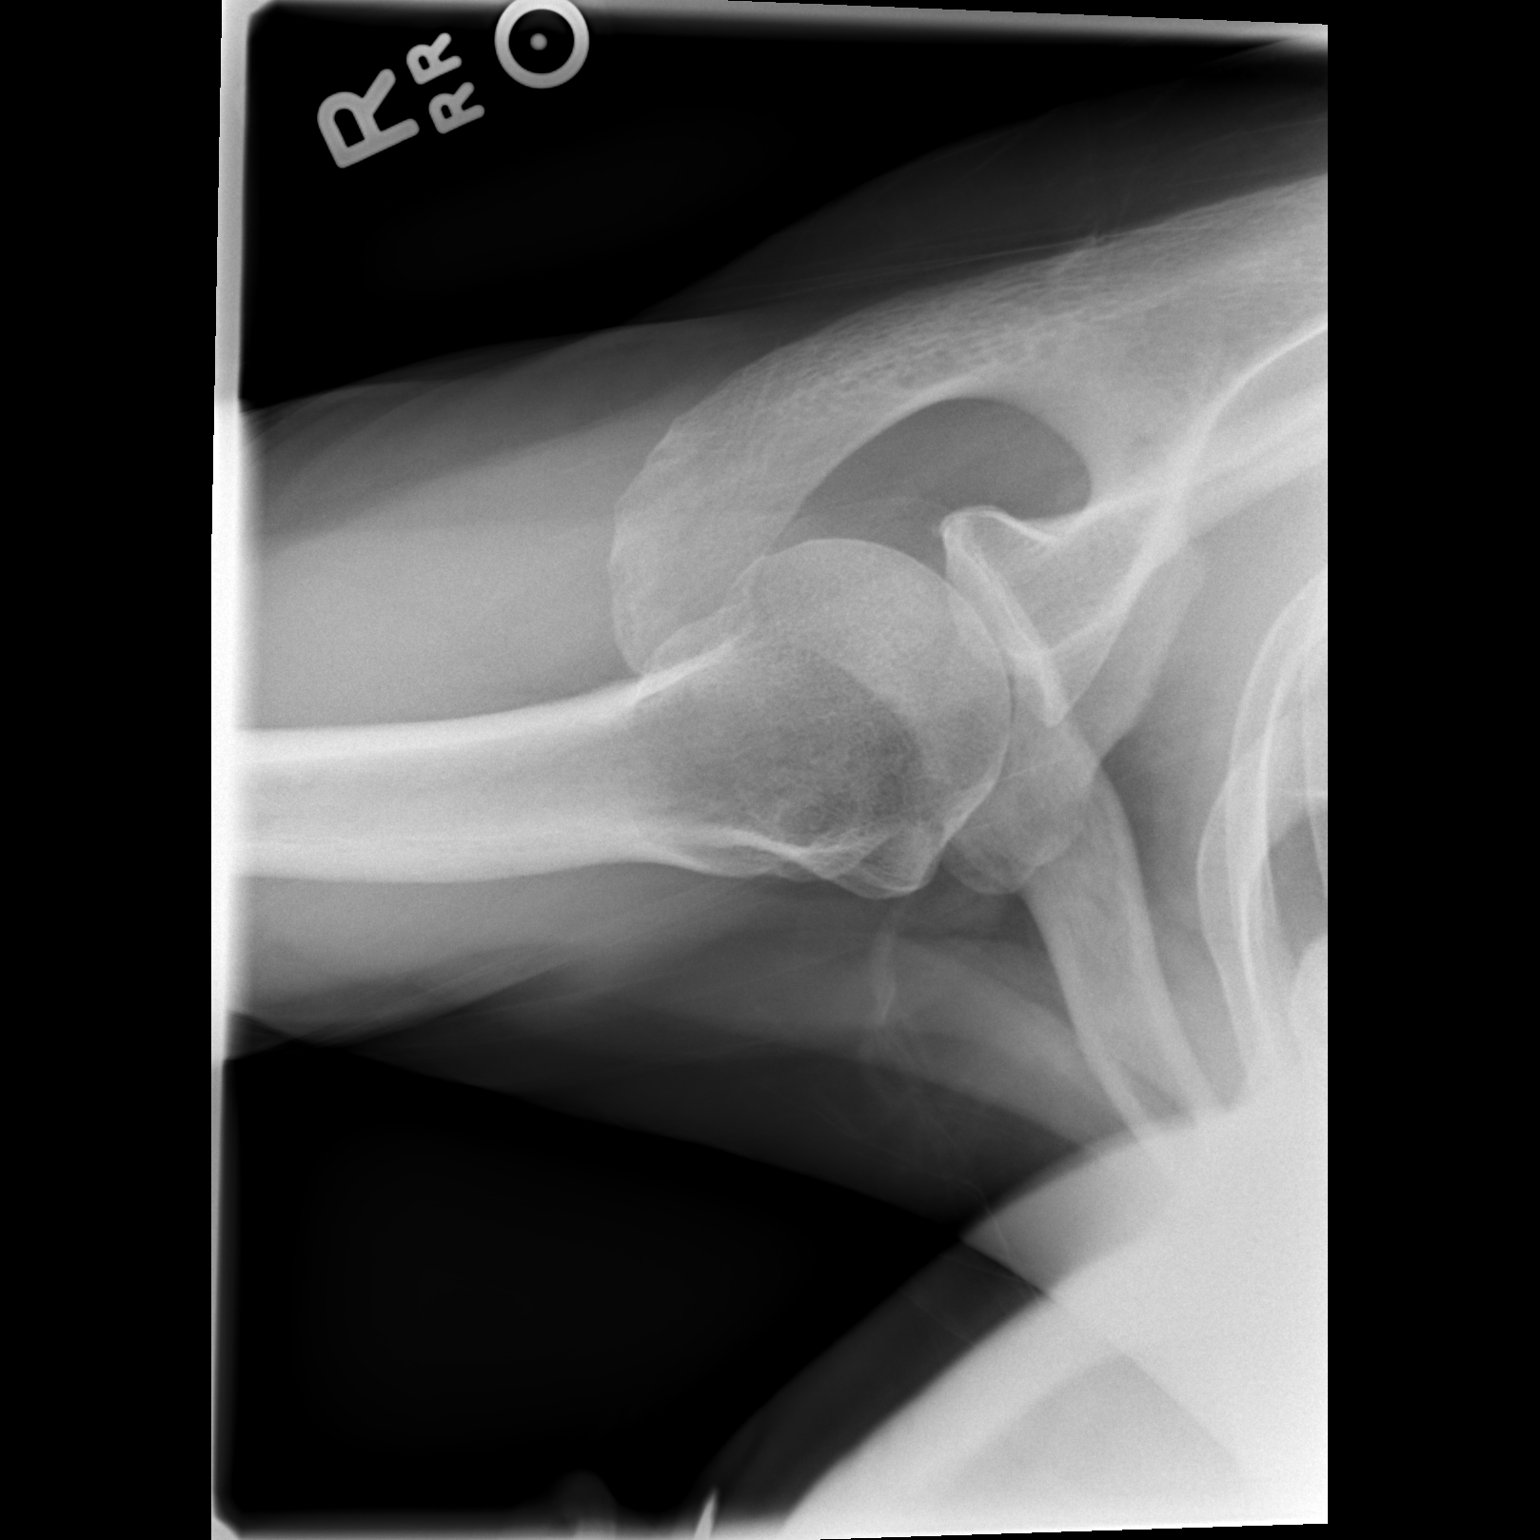

[2 of 2 positions shown; findings below may reference images not displayed]

FINDINGS: There is no evidence of fracture or dislocation. There is no
evidence of arthropathy or other focal bone abnormality. Soft
tissues are unremarkable.
IMPRESSION: Negative.

## 2013-11-09 MED ORDER — IBUPROFEN 800 MG PO TABS
800.0000 mg | ORAL_TABLET | Freq: Once | ORAL | Status: AC
  Administered 2013-11-09: 800 mg via ORAL
  Filled 2013-11-09: qty 1

## 2013-11-09 NOTE — ED Notes (Signed)
Patient was thrown from horse on Tuesday, c/o R shoulder pain

## 2013-11-09 NOTE — Discharge Instructions (Signed)
Contusion °A contusion is a deep bruise. Contusions happen when an injury causes bleeding under the skin. Signs of bruising include pain, puffiness (swelling), and discolored skin. The contusion may turn blue, purple, or yellow. °HOME CARE  °· Put ice on the injured area. °¨ Put ice in a plastic bag. °¨ Place a towel between your skin and the bag. °¨ Leave the ice on for 15-20 minutes, 03-04 times a day. °· Only take medicine as told by your doctor. °· Rest the injured area. °· If possible, raise (elevate) the injured area to lessen puffiness. °GET HELP RIGHT AWAY IF:  °· You have more bruising or puffiness. °· You have pain that is getting worse. °· Your puffiness or pain is not helped by medicine. °MAKE SURE YOU:  °· Understand these instructions. °· Will watch your condition. °· Will get help right away if you are not doing well or get worse. °Document Released: 08/26/2007 Document Revised: 06/01/2011 Document Reviewed: 01/12/2011 °[employer]® Patient Information ©2015 [employer]. This information is not intended to replace advice given to you by your health care provider. Make sure you discuss any questions you have with your health care provider. ° °

## 2013-11-09 NOTE — ED Provider Notes (Signed)
CSN: [id number]     Arrival date & time 11/09/13  4098 History   First MD Initiated Contact with Patient 11/09/13 [phone]     Chief Complaint  Patient presents with  . Shoulder Pain     (Consider location/radiation/quality/duration/timing/severity/associated sxs/prior Treatment) HPI 39 y.o. female who states that she fell from a horse 2 days ago. She states that she landed on her right side but did not strike her head. She had no loss of consciousness. She was able to right and worse on down the hill side. She states that the pain seemed worse later that night. She can home from out of town last night and states that she drank alcohol to help relieve the pain. She's complaining of pain in her right shoulder and right arm. She denies any chest pain, dyspnea, or abdominal pain. History reviewed. No pertinent past medical history. Past Surgical History  Procedure Laterality Date  . Cholecystectomy     No family history on file. History  Substance Use Topics  . Smoking status: Current Every Day Smoker  . Smokeless tobacco: Not on file  . Alcohol Use: Yes   OB History   Grav Para Term Preterm Abortions TAB SAB Ect Mult Living                 Review of Systems  All other systems reviewed and are negative.     Allergies  Penicillins and Rocephin  Home Medications   Prior to Admission medications   Medication Sig Start Date End Date Taking? Authorizing Provider  IBUPROFEN PO Take by mouth.   Yes Historical Provider, MD  oxyCODONE-acetaminophen (PERCOCET/ROXICET) 5-325 MG per tablet Take 1-2 tablets by mouth every 4 (four) hours as needed for severe pain. 07/02/13   Shari A Upstill, PA-C  silver sulfADIAZINE (SILVADENE) 1 % cream Apply 1 application topically daily. 07/02/13   Shari A Upstill, PA-C   BP 129/81  Pulse 80  Temp(Src) 98.3 F (36.8 C) (Oral)  Resp 16  Ht 5\' 3"  (1.6 m)  Wt 132 lb (59.875 kg)  BMI 23.39 kg/m2  SpO2 100%  LMP 11/05/2013 Physical Exam  Nursing note  and vitals reviewed. Constitutional: She is oriented to person, place, and time. She appears well-developed and well-nourished.  HENT:  Head: Normocephalic and atraumatic.  Right Ear: External ear normal.  Left Ear: External ear normal.  Nose: Nose normal.  Mouth/Throat: Oropharynx is clear and moist.  Eyes: Conjunctivae and EOM are normal. Pupils are equal, round, and reactive to light.  Neck: Normal range of motion. Neck supple.  Cardiovascular: Normal rate, regular rhythm, normal heart sounds and intact distal pulses.   Pulmonary/Chest: Effort normal and breath sounds normal.  Abdominal: Soft. Bowel sounds are normal.  Musculoskeletal: Normal range of motion.  Some tenderness to palpation diffusely over right shoulder, right clavicle, right scapula, and right elbow. She is neurovascularly intact throughout right upper extremity and able to move all joints through a full active range of motion. Neck is nontender to palpation.  Neurological: She is alert and oriented to person, place, and time. She has normal reflexes.  Skin: Skin is warm and dry.  Psychiatric: She has a normal mood and affect. Her behavior is normal. Judgment and thought content normal.    ED Course  Procedures (including critical care time) Labs Review Labs Reviewed - No data to display  Imaging Review Dg Scapula Right  11/09/2013   CLINICAL DATA:  SHOULDER PAIN  EXAM: RIGHT SCAPULA - 2+  VIEWS  COMPARISON:  05/27/2013 by report only  FINDINGS: There is no evidence of fracture or other focal bone lesions. Soft tissues are unremarkable.  IMPRESSION: Negative.   Electronically Signed   By: Oley Balmaniel  Hassell M.D.   On: 11/09/2013 10:23   Dg Shoulder Right  11/09/2013   CLINICAL DATA:  SHOULDER PAIN  EXAM: RIGHT SHOULDER - 2+ VIEW  COMPARISON:  None.  FINDINGS: There is no evidence of fracture or dislocation. There is no evidence of arthropathy or other focal bone abnormality. Soft tissues are unremarkable.  IMPRESSION:  Negative.   Electronically Signed   By: Oley Balmaniel  Hassell M.D.   On: 11/09/2013 10:22   Dg Elbow Complete Right  11/09/2013   CLINICAL DATA:  SHOULDER PAIN  EXAM: RIGHT ELBOW - COMPLETE 3+ VIEW  COMPARISON:  None.  FINDINGS: There is no evidence of fracture, dislocation, or joint effusion. There is no evidence of arthropathy or other focal bone abnormality. Soft tissues are unremarkable.  IMPRESSION: Negative.   Electronically Signed   By: Oley Balmaniel  Hassell M.D.   On: 11/09/2013 10:23     EKG Interpretation None      MDM   Final diagnoses:  Shoulder pain, acute, right  Fall from horse, initial encounter     Hilario Quarryanielle S Chares Slaymaker, MD 11/13/13 2029

## 2013-11-29 IMAGING — CR DG SCAPULA*R*
2 series · 2 of 2 positions shown · non-contrast
Comparison: [DATE] by report only

CLINICAL DATA: SHOULDER PAIN

EXAM:
RIGHT SCAPULA - 2+ VIEWS

[w scapula ap/pa right *]
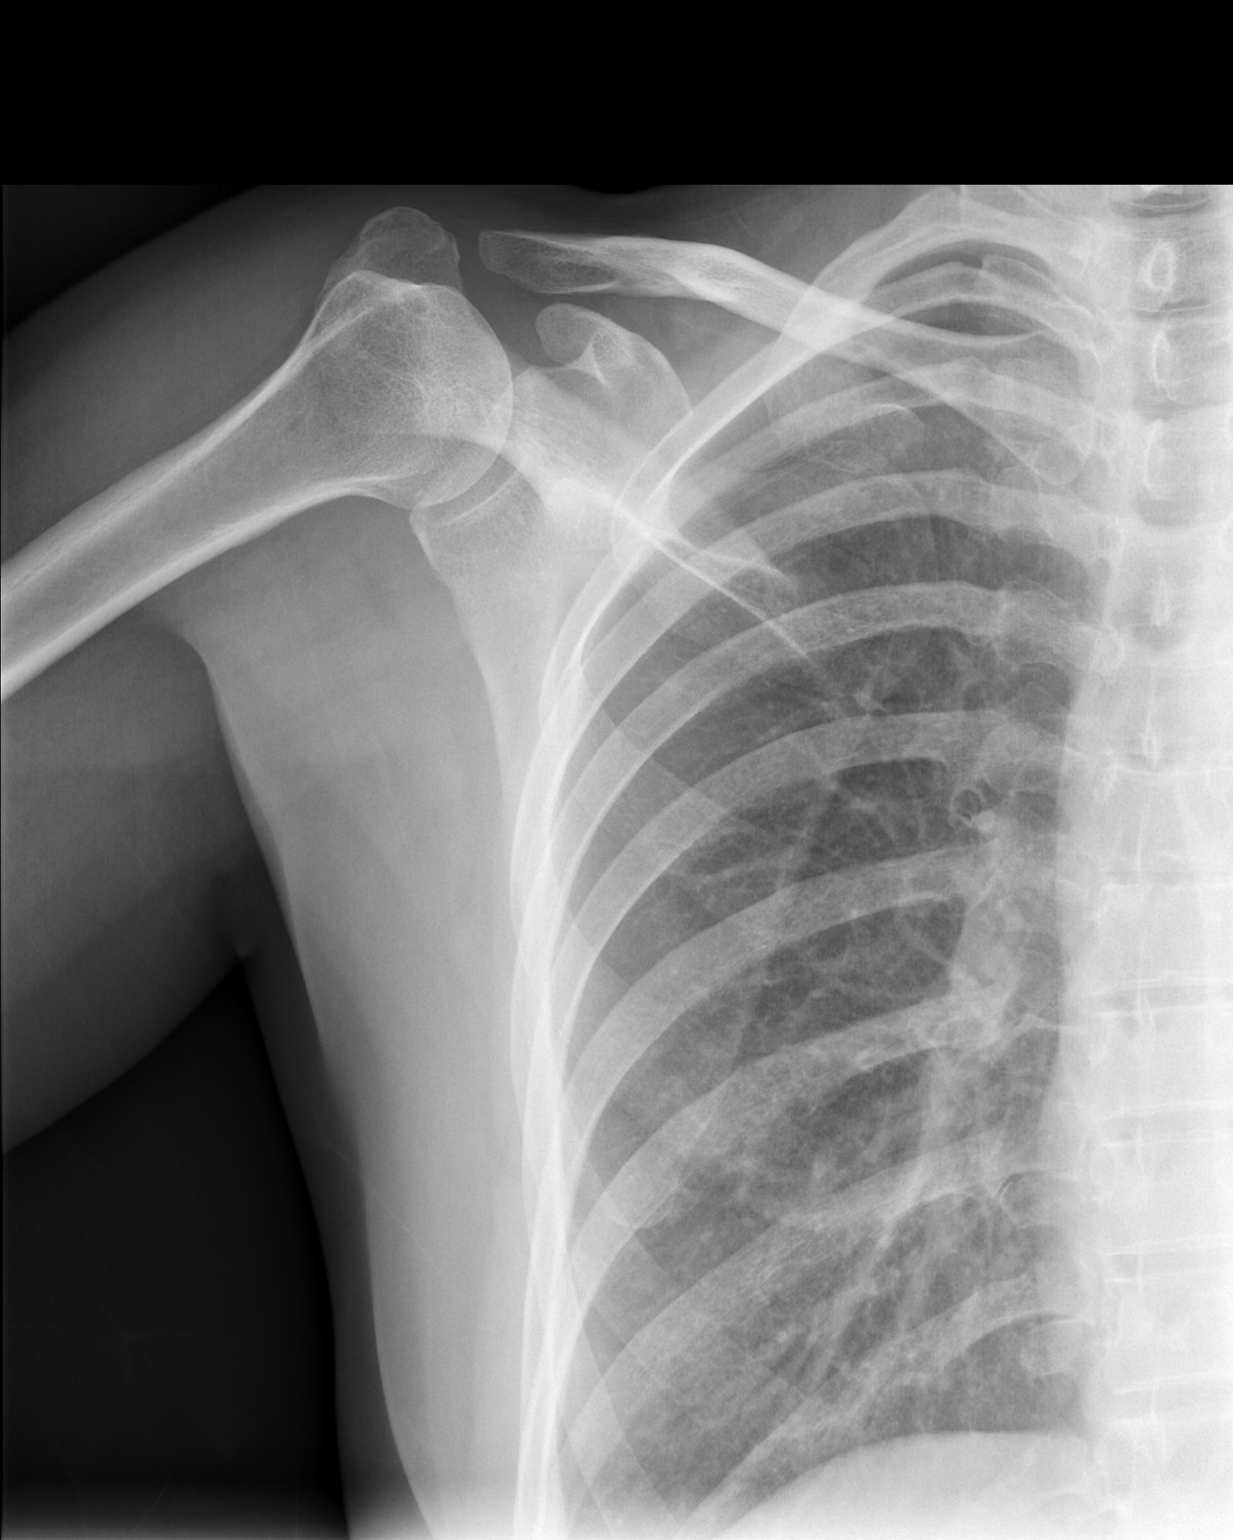

[w scapula lat right *]
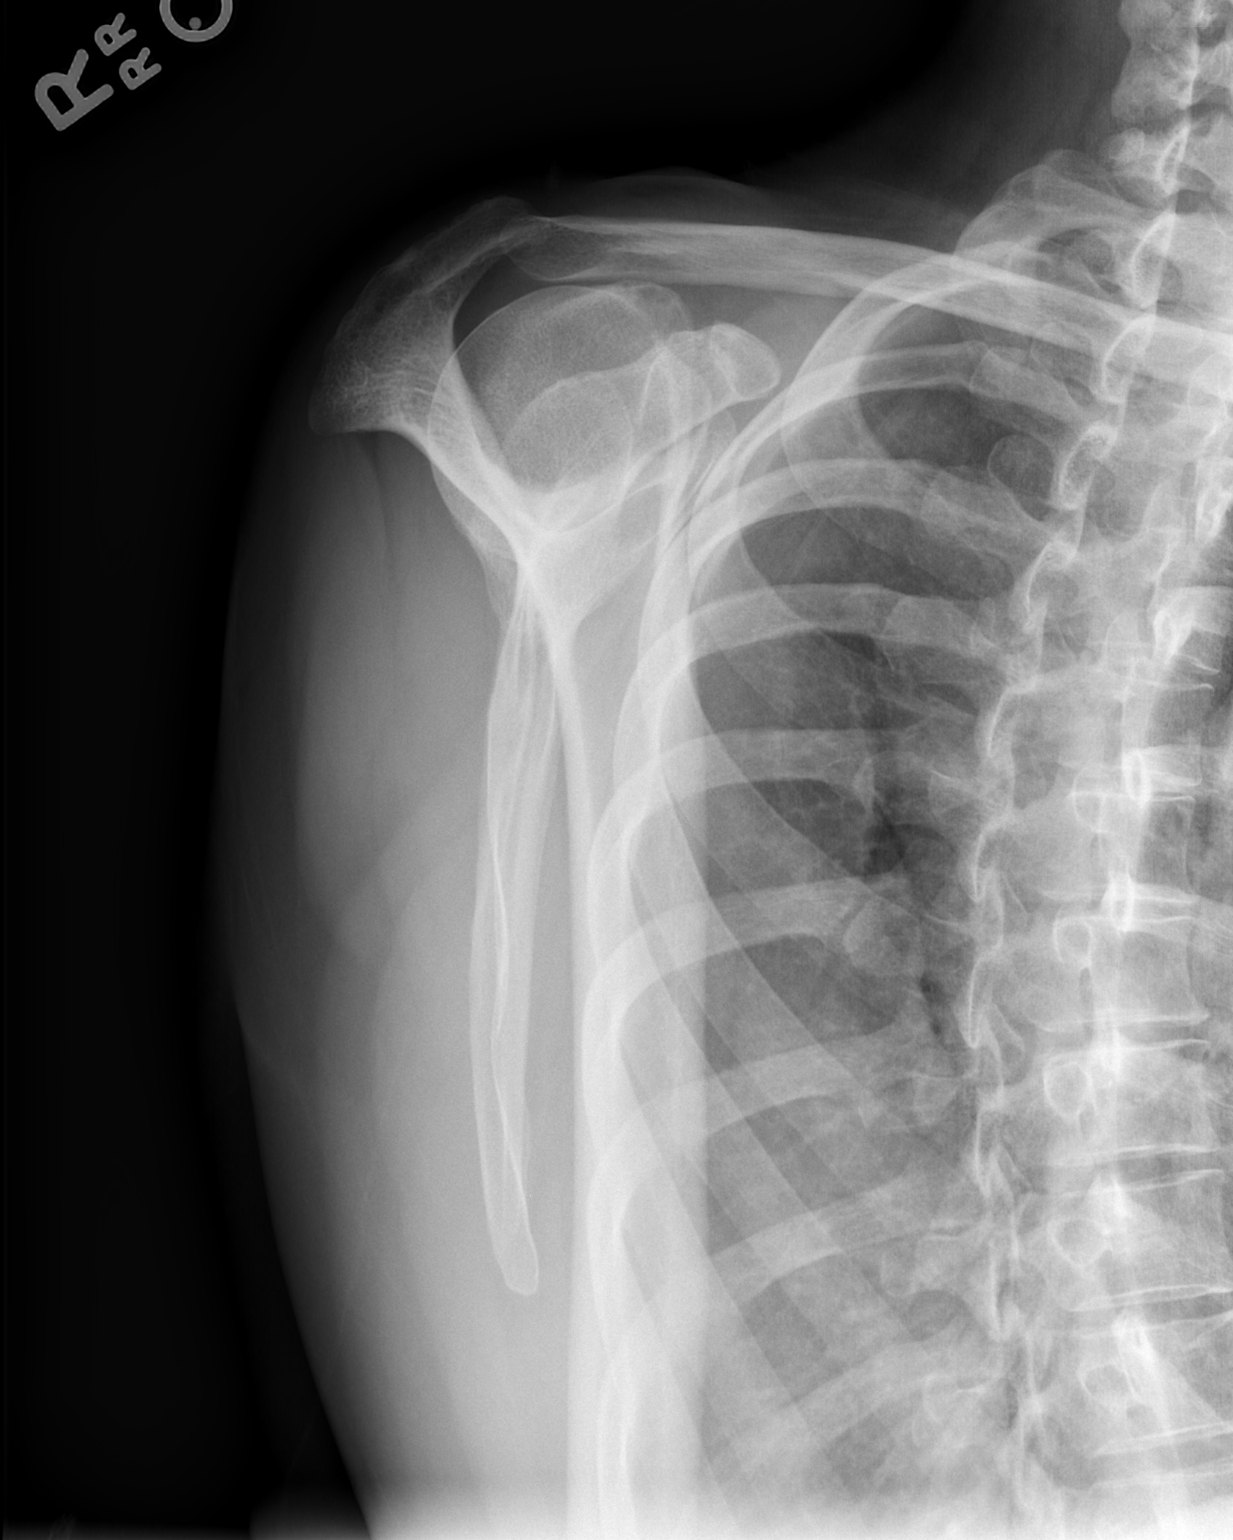

[2 of 2 positions shown; findings below may reference images not displayed]

FINDINGS: There is no evidence of fracture or other focal bone lesions. Soft
tissues are unremarkable.
IMPRESSION: Negative.

## 2016-05-08 ENCOUNTER — Emergency Department (HOSPITAL_BASED_OUTPATIENT_CLINIC_OR_DEPARTMENT_OTHER)
Admission: EM | Admit: 2016-05-08 | Discharge: 2016-05-08 | Disposition: A | Discharge status: Home / Self Care | Payer: Medicaid Other | Attending: Emergency Medicine | Admitting: Emergency Medicine

## 2016-05-08 ENCOUNTER — Encounter (HOSPITAL_BASED_OUTPATIENT_CLINIC_OR_DEPARTMENT_OTHER): Payer: Self-pay | Admitting: Emergency Medicine

## 2016-05-08 DIAGNOSIS — F17200 Nicotine dependence, unspecified, uncomplicated: Secondary | ICD-10-CM | POA: Diagnosis not present

## 2016-05-08 DIAGNOSIS — Z79899 Other long term (current) drug therapy: Secondary | ICD-10-CM | POA: Insufficient documentation

## 2016-05-08 DIAGNOSIS — N644 Mastodynia: Secondary | ICD-10-CM

## 2016-05-08 HISTORY — DX: Depression, unspecified: F32.A

## 2016-05-08 HISTORY — DX: Dorsalgia, unspecified: M54.9

## 2016-05-08 HISTORY — DX: Major depressive disorder, single episode, unspecified: F32.9

## 2016-05-08 NOTE — ED Triage Notes (Signed)
Pt having left breast pain for three weeks.  Some localized swelling and warmth.  Pt thinks she feels a lump.  No nipple discharge. No known fever.

## 2016-05-08 NOTE — ED Notes (Signed)
In with Dr. Shela CommonsJ to chaperone for breast exam.

## 2016-05-08 NOTE — ED Provider Notes (Signed)
[facility] Provider [facility]   CSN: [id number] Arrival date & time: 05/08/16  0920     History   Chief Complaint Chief Complaint  Patient presents with  . Breast Pain    HPI Breanna Wise is a 40 y.o. female.  HPI complains of left breast pain for the past 2.5 weeks. Pain is worse with moving her arm. Improved with remaining still She feels a "knot" immediately end of the nipple. She denies fever denies other complaint. Treated herself with ibuprofen, without relief. No other associated symptoms.  Past Medical History:  Diagnosis Date  . Back pain   . Depression     There are no active problems to display for this patient.   Past Surgical History:  Procedure Laterality Date  . CHOLECYSTECTOMY      OB History    No data available       Home Medications    Prior to Admission medications   Medication Sig Start Date End Date Taking? Authorizing Provider  escitalopram (LEXAPRO) 20 MG tablet Take 20 mg by mouth daily.   Yes Historical Provider, MD  gabapentin (NEURONTIN) 100 MG capsule Take 100 mg by mouth at bedtime.   Yes Historical Provider, MD    Family History No family history on file.  Social History Social History  Substance Use Topics  . Smoking status: Current Every Day Smoker  . Smokeless tobacco: Never Used  . Alcohol use Yes     Allergies   Penicillins and Rocephin [ceftriaxone sodium in dextrose]   Review of Systems Review of Systems  Constitutional: Negative.   HENT: Negative.   Respiratory: Negative.   Cardiovascular: Negative.   Gastrointestinal: Negative.   Endocrine:       Left breast pain  Musculoskeletal: Negative.   Skin: Negative.   Neurological: Negative.   Psychiatric/Behavioral: Negative.   All other systems reviewed and are negative.    Physical Exam Updated Vital Signs BP 131/90 (BP Location: Right Arm)   Pulse 84   Temp 97.8 F (36.6 C) (Oral)   Resp 18   Ht 5\' 3"  (1.6 m)   Wt 176 lb (79.8  kg)   LMP 05/04/2016   SpO2 100%   BMI 31.18 kg/m   Physical Exam  Constitutional: She appears well-developed and well-nourished. No distress.  HENT:  Head: Normocephalic and atraumatic.  Eyes: Conjunctivae are normal. Pupils are equal, round, and reactive to light.  Neck: Neck supple. No tracheal deviation present. No thyromegaly present.  Cardiovascular: Normal rate and regular rhythm.   No murmur heard. Pulmonary/Chest: Effort normal and breath sounds normal.  Abdominal: Soft. Bowel sounds are normal. She exhibits no distension. There is no tenderness.  Genitourinary:  Genitourinary Comments: Bilateral breasts nipple appear normal. Left breast is diffusely tender, without redness or swelling. No masses no asymmetry. No axillary nodes.  Musculoskeletal: Normal range of motion. She exhibits no edema or tenderness.  Neurological: She is alert. Coordination normal.  Skin: Skin is warm and dry. No rash noted.  Psychiatric: She has a normal mood and affect.  Nursing note and vitals reviewed.    ED Treatments / Results  Labs (all labs ordered are listed, but only abnormal results are displayed) Labs Reviewed - No data to display  EKG  EKG Interpretation None       Radiology No results found.  Procedures Procedures (including critical care time)  Medications Ordered in ED Medications - No data to display   Initial Impression / Assessment  and Plan / ED Course  I have reviewed the triage vital signs and the nursing notes.  Pertinent labs & imaging results that were available during my care of the patient were reviewed by me and considered in my medical decision making (see chart for details).     Patient requires nonemergent imaging of her breast. I spoke with Dr.Hassan, her primary care physician he will arrange for breast imaging at breast Center and reports to be sent to him. She can take Tylenol or Advil for pain. [place] Controlled Substance reporting  System queried Patient received prescription for 75 Percocet tablets on 04/30/2016 and  For 30 clonazepam tablets on 04/27/2016  Final Clinical Impressions(s) / ED Diagnoses  Diagnosis left breast pain Final diagnoses:  Breast pain, left    New Prescriptions New Prescriptions   No medications on file     Doug SouSam Radek Carnero, MD 05/08/16 1040

## 2016-05-08 NOTE — Discharge Instructions (Signed)
Call Dr. Ginette OttoHassan's office when you get home today. He will arrange for you to have further [facility] [place] [facility] [place] [place] breast pain. Take Tylenol or Advil for mild pain or take the Percocet prescribed to you on 04/30/2016 for bad pain. Don't take Tylenol together with Percocet as the combination can be dangerous to your liver

## 2017-02-14 ENCOUNTER — Emergency Department (HOSPITAL_BASED_OUTPATIENT_CLINIC_OR_DEPARTMENT_OTHER)
Admission: EM | Admit: 2017-02-14 | Discharge: 2017-02-14 | Disposition: A | Discharge status: Home / Self Care | Payer: Self-pay | Attending: Emergency Medicine | Admitting: Emergency Medicine

## 2017-02-14 ENCOUNTER — Other Ambulatory Visit: Payer: Self-pay

## 2017-02-14 ENCOUNTER — Encounter (HOSPITAL_BASED_OUTPATIENT_CLINIC_OR_DEPARTMENT_OTHER): Payer: Self-pay | Admitting: Emergency Medicine

## 2017-02-14 DIAGNOSIS — Z79899 Other long term (current) drug therapy: Secondary | ICD-10-CM | POA: Insufficient documentation

## 2017-02-14 DIAGNOSIS — M5442 Lumbago with sciatica, left side: Secondary | ICD-10-CM | POA: Insufficient documentation

## 2017-02-14 DIAGNOSIS — G8929 Other chronic pain: Secondary | ICD-10-CM | POA: Insufficient documentation

## 2017-02-14 DIAGNOSIS — F17200 Nicotine dependence, unspecified, uncomplicated: Secondary | ICD-10-CM | POA: Insufficient documentation

## 2017-02-14 MED ORDER — METHOCARBAMOL 500 MG PO TABS
500.0000 mg | ORAL_TABLET | Freq: Two times a day (BID) | ORAL | 0 refills | Status: DC
Start: 2017-02-14 — End: 2020-10-08

## 2017-02-14 NOTE — ED Provider Notes (Signed)
[facility] EMERGENCY DEPARTMENT Provider Note   CSN: [id number] Arrival date & time: 02/14/17  1342     History   Chief Complaint Chief Complaint  Patient presents with  . Back Pain    HPI Breanna Wise is a 41 y.o. female.  HPI    41 year old female presents today with chronic back pain.  Patient notes over the last 6 months she has had lower back pain with radiation to her left lower leg.  She also notes left hip pain.  Patient has been seen numerous times by both orthopedic surgeon, sports medicine professionals, and primary care.  She is taking oxycodone as needed for pain.  She has had MRIs, plain films of her back and hip with mild degenerative changes, no other acute findings.  Patient reports that she no longer has the oxycodone as she has run out, but has a follow-up appoint with her primary care.  She reports a vague sense of loss of sensation around the proximal left thigh, no strength or reflexive deficits.  She denies any fever, denies any other concerning signs or symptoms.   Past Medical History:  Diagnosis Date  . Back pain   . Depression     There are no active problems to display for this patient.   Past Surgical History:  Procedure Laterality Date  . CHOLECYSTECTOMY      OB History    No data available       Home Medications    Prior to Admission medications   Medication Sig Start Date End Date Taking? Authorizing Provider  clonazePAM (KLONOPIN) 0.5 MG tablet Take 0.5 mg by mouth 2 (two) times daily as needed for anxiety.   Yes [provider]  oxyCODONE-acetaminophen (PERCOCET/ROXICET) 5-325 MG tablet Take by mouth every 4 (four) hours as needed for severe pain.   Yes [provider]  escitalopram (LEXAPRO) 20 MG tablet Take 20 mg by mouth daily.    [provider]  gabapentin (NEURONTIN) 100 MG capsule Take 100 mg by mouth at bedtime.    [provider]  methocarbamol (ROBAXIN) 500 MG tablet  Take 1 tablet (500 mg total) by mouth 2 (two) times daily. 02/14/17   Eyvonne MechanicHedges, Jacia Sickman, PA-C    Family History History reviewed. No pertinent family history.  Social History Social History   Tobacco Use  . Smoking status: Current Every Day Smoker  . Smokeless tobacco: Never Used  Substance Use Topics  . Alcohol use: Yes  . Drug use: No     Allergies   Penicillins and Rocephin [ceftriaxone sodium in dextrose]   Review of Systems Review of Systems  All other systems reviewed and are negative.    Physical Exam Updated Vital Signs BP 134/85 (BP Location: Left Arm)   Pulse 90   Temp 98.3 F (36.8 C) (Oral)   Resp 19   Ht 5\' 3"  (1.6 m)   Wt 77.1 kg (170 lb)   LMP 02/10/2017   SpO2 100%   BMI 30.11 kg/m   Physical Exam  Constitutional: She is oriented to person, place, and time. She appears well-developed and well-nourished.  HENT:  Head: Normocephalic and atraumatic.  Eyes: Conjunctivae are normal. Pupils are equal, round, and reactive to light. Right eye exhibits no discharge. Left eye exhibits no discharge. No scleral icterus.  Neck: Normal range of motion. No JVD present. No tracheal deviation present.  Pulmonary/Chest: Effort normal. No stridor.  Musculoskeletal:  Exquisite tenderness to the surrounding muscles of  the thoracic and lumbar vertebrae, no focal spinal tenderness-decreased sensation over the anterior left thigh remainder lower extremity within normal limits, patellar reflexes 2+-bilateral  Neurological: She is alert and oriented to person, place, and time. Coordination normal.  Psychiatric: She has a normal mood and affect. Her behavior is normal. Judgment and thought content normal.  Nursing note and vitals reviewed.    ED Treatments / Results  Labs (all labs ordered are listed, but only abnormal results are displayed) Labs Reviewed - No data to display  EKG  EKG Interpretation None       Radiology No results  found.  Procedures Procedures (including critical care time)  Medications Ordered in ED Medications - No data to display   Initial Impression / Assessment and Plan / ED Course  I have reviewed the triage vital signs and the nursing notes.  Pertinent labs & imaging results that were available during my care of the patient were reviewed by me and considered in my medical decision making (see chart for details).      Final Clinical Impressions(s) / ED Diagnoses   Final diagnoses:  Chronic bilateral low back pain with left-sided sciatica    41 year old female presents today with chronic back pain.  No acute changes, no red flags.  Patient has previous MRI showing no significant neurological deficits.  Patient is already on chronic pain medication for this, she will be started on muscle relaxers encouraged to follow-up as an outpatient with neurosurgery and her primary care.  She is given strict return precautions, she verbalized understanding and agreement to today's plan had no further questions or concerns at the time of discharge.  ED Discharge Orders        Ordered    methocarbamol (ROBAXIN) 500 MG tablet  2 times daily     02/14/17 1637       Rosalio LoudHedges, Keaundre Thelin, PA-C 02/14/17 1727    Gwyneth SproutPlunkett, Whitney, MD 02/14/17 2034

## 2017-02-14 NOTE — ED Triage Notes (Signed)
Patient reports that she has chronic pain to her back. THe patient reports that she is having worse pain to her lower back and her left leg.

## 2017-02-14 NOTE — Discharge Instructions (Signed)
Please read attached information. If you experience any new or worsening signs or symptoms please return to the emergency room for evaluation. Please follow-up with your primary care provider or specialist as discussed.  °

## 2017-11-07 ENCOUNTER — Encounter (HOSPITAL_BASED_OUTPATIENT_CLINIC_OR_DEPARTMENT_OTHER): Payer: Self-pay | Admitting: Emergency Medicine

## 2017-11-07 ENCOUNTER — Emergency Department (HOSPITAL_BASED_OUTPATIENT_CLINIC_OR_DEPARTMENT_OTHER)
Admission: EM | Admit: 2017-11-07 | Discharge: 2017-11-07 | Disposition: A | Discharge status: Home / Self Care | Payer: Self-pay | Attending: Emergency Medicine | Admitting: Emergency Medicine

## 2017-11-07 ENCOUNTER — Other Ambulatory Visit: Payer: Self-pay

## 2017-11-07 ENCOUNTER — Emergency Department (HOSPITAL_BASED_OUTPATIENT_CLINIC_OR_DEPARTMENT_OTHER): Payer: Self-pay

## 2017-11-07 DIAGNOSIS — R1030 Lower abdominal pain, unspecified: Secondary | ICD-10-CM | POA: Insufficient documentation

## 2017-11-07 DIAGNOSIS — F17210 Nicotine dependence, cigarettes, uncomplicated: Secondary | ICD-10-CM | POA: Insufficient documentation

## 2017-11-07 DIAGNOSIS — R109 Unspecified abdominal pain: Secondary | ICD-10-CM

## 2017-11-07 DIAGNOSIS — Z79899 Other long term (current) drug therapy: Secondary | ICD-10-CM | POA: Insufficient documentation

## 2017-11-07 LAB — URINALYSIS, ROUTINE W REFLEX MICROSCOPIC
BILIRUBIN URINE: NEGATIVE
Glucose, UA: NEGATIVE mg/dL
Hgb urine dipstick: NEGATIVE
KETONES UR: NEGATIVE mg/dL
Leukocytes, UA: NEGATIVE
NITRITE: NEGATIVE
PH: 6 (ref 5.0–8.0)
PROTEIN: NEGATIVE mg/dL
Specific Gravity, Urine: 1.01 (ref 1.005–1.030)

## 2017-11-07 LAB — PREGNANCY, URINE: Preg Test, Ur: NEGATIVE

## 2017-11-07 MED ORDER — KETOROLAC TROMETHAMINE 15 MG/ML IJ SOLN
15.0000 mg | Freq: Once | INTRAMUSCULAR | Status: AC
  Administered 2017-11-07: 15 mg via INTRAMUSCULAR
  Filled 2017-11-07: qty 1

## 2017-11-07 MED ORDER — METHOCARBAMOL 500 MG PO TABS
750.0000 mg | ORAL_TABLET | Freq: Once | ORAL | Status: DC
  Filled 2017-11-07: qty 2

## 2017-11-07 NOTE — ED Notes (Signed)
Patient in Ultrasound.

## 2017-11-07 NOTE — ED Provider Notes (Signed)
[facility] EMERGENCY DEPARTMENT Provider Note   CSN: [id number] Arrival date & time: 11/07/17  1347     History   Chief Complaint Chief Complaint  Patient presents with  . Back Pain    HPI Breanna Wise is a 42 y.o. female.  42 y/o female with a PMH of Chronic back pain, depression and Anxiety presents to the ED with a chief complaint of BL flank pain, and lower abdominal pain x 4 days. She reports the pain is ripping in nature and states feels like a "kidney infection" she has had in the past. Patient also reports nausea.She describes her lower abdominal pain as sharp on/off. Patient has tried tylenol, percocet and heating pad but has had no relieve in symptoms. Patient has been drinking fluids but now states she is unable to do so because she has a metallic taste in her mouth .Patient's last BM was this morning. She denies any dysuria, vomiting, gynecological issues or other complaints.      Past Medical History:  Diagnosis Date  . Back pain   . Depression     There are no active problems to display for this patient.   Past Surgical History:  Procedure Laterality Date  . CHOLECYSTECTOMY       OB History   None      Home Medications    Prior to Admission medications   Medication Sig Start Date End Date Taking? Authorizing Provider  clonazePAM (KLONOPIN) 0.5 MG tablet Take 0.5 mg by mouth 2 (two) times daily as needed for anxiety.    [provider]  escitalopram (LEXAPRO) 20 MG tablet Take 20 mg by mouth daily.    [provider]  gabapentin (NEURONTIN) 100 MG capsule Take 100 mg by mouth at bedtime.    [provider]  methocarbamol (ROBAXIN) 500 MG tablet Take 1 tablet (500 mg total) by mouth 2 (two) times daily. 02/14/17   Hedges, Tinnie GensJeffrey, PA-C  oxyCODONE-acetaminophen (PERCOCET/ROXICET) 5-325 MG tablet Take by mouth every 4 (four) hours as needed for severe pain.    [provider]    Family History No  family history on file.  Social History Social History   Tobacco Use  . Smoking status: Current Every Day Smoker  . Smokeless tobacco: Never Used  Substance Use Topics  . Alcohol use: Yes  . Drug use: No     Allergies   Penicillins and Rocephin [ceftriaxone sodium in dextrose]   Review of Systems Review of Systems  Constitutional: Negative for chills and fever.  HENT: Negative for ear pain and sore throat.   Eyes: Negative for pain and visual disturbance.  Respiratory: Negative for cough and shortness of breath.   Cardiovascular: Negative for chest pain and palpitations.  Gastrointestinal: Positive for abdominal pain and nausea. Negative for constipation, diarrhea and vomiting.  Genitourinary: Positive for flank pain. Negative for dysuria and hematuria.  Musculoskeletal: Negative for arthralgias and back pain.  Skin: Negative for color change and rash.  Neurological: Negative for seizures and syncope.  All other systems reviewed and are negative.    Physical Exam Updated Vital Signs BP 117/76 (BP Location: Right Arm)   Pulse 97   Temp 98.4 F (36.9 C) (Oral)   Resp 20   Ht 5\' 3"  (1.6 m)   Wt 78 kg   LMP 10/21/2017   SpO2 98%   BMI 30.47 kg/m   Physical Exam  Constitutional: She appears well-developed and well-nourished. No distress.  HENT:  Head: Normocephalic and atraumatic.  Eyes: Conjunctivae are normal.  Neck: Neck supple.  Cardiovascular: Normal rate and regular rhythm.  No murmur heard. Pulmonary/Chest: Effort normal and breath sounds normal. No respiratory distress.  Abdominal: Soft. Bowel sounds are decreased. There is no tenderness. There is CVA tenderness. There is no rigidity, no rebound, no guarding, no tenderness at McBurney's point and negative Murphy's sign.  BL CVA tenderness with palpation.  Musculoskeletal: She exhibits no edema.  Neurological: She is alert.  Skin: Skin is warm and dry.  Psychiatric: She has a normal mood and affect.    Nursing note and vitals reviewed.    ED Treatments / Results  Labs (all labs ordered are listed, but only abnormal results are displayed) Labs Reviewed  URINALYSIS, ROUTINE W REFLEX MICROSCOPIC  PREGNANCY, URINE    EKG None  Radiology [place] Renal  Result Date: 11/07/2017 CLINICAL DATA:  Severe bilateral flank pain. EXAM: RENAL / URINARY TRACT ULTRASOUND COMPLETE COMPARISON:  CT abdomen pelvis dated November 27, 2009. FINDINGS: Right Kidney: Length: 9.8 cm. Echogenicity within normal limits. No mass or hydronephrosis visualized. Left Kidney: Length: 9.7 cm. Echogenicity within normal limits. No mass or hydronephrosis visualized. Bladder: Appears normal for degree of bladder distention. IMPRESSION: Normal renal ultrasound. Electronically Signed   By: Obie DredgeWilliam T Derry M.D.   On: 11/07/2017 15:53    Procedures Procedures (including critical care time)  Medications Ordered in ED Medications  methocarbamol (ROBAXIN) tablet 750 mg (750 mg Oral Not Given 11/07/17 1515)  ketorolac (TORADOL) 15 MG/ML injection 15 mg (15 mg Intramuscular Given 11/07/17 1510)     Initial Impression / Assessment and Plan / ED Course  I have reviewed the triage vital signs and the nursing notes.  Pertinent labs & imaging results that were available during my care of the patient were reviewed by me and considered in my medical decision making (see chart for details).     Upon examination patient exhibits BL CVA tenderness.  Patient needs a shot of Toradol which helped with her pain.  Ultrasound renal was ordered to rule out any nephrolithiasis.  US is negative for any hydronephrosis, nephrolithiasis, stone in ureter or other acute abnormalities.  Spoken to patient and reassure her that at this time her UA was negative for any infection and ultrasound was negative for any stone I will have patient follow-up with her PCP this week for reevaluation and symptoms she currently sitting in the chair in the room  comfortable.  Patient's pulses were symmetric she is currently not hypertensive, I believe this is less likely to be dissection.  I have advised patient continue taking her Percocet for pain this time I cannot prescribe her anything stronger.  Return precautions have been discussed.  Stable for discharge.  Final Clinical Impressions(s) / ED Diagnoses   Final diagnoses:  Flank pain    ED Discharge Orders    None       Claude MangesSoto, Ambre Kobayashi, PA-C 11/07/17 1625    Loren RacerYelverton, David, MD 11/09/17 1726

## 2017-11-07 NOTE — ED Triage Notes (Signed)
Pt states her kidneys have been hurting since 3:30 this morning with SOB and chills.

## 2017-11-07 NOTE — ED Notes (Signed)
Pt decline vital sign update. States nobody has been in here.

## 2017-11-07 NOTE — ED Notes (Signed)
ED Provider at bedside. 

## 2017-11-07 NOTE — Discharge Instructions (Addendum)
Please follow up with PCP in 1 week for evaluation of your symptoms.  Continue to hydrate with fluids and water.If your Tums worsen please return to the ED for reevaluation.

## 2019-03-28 ENCOUNTER — Encounter (HOSPITAL_BASED_OUTPATIENT_CLINIC_OR_DEPARTMENT_OTHER): Payer: Self-pay | Admitting: Emergency Medicine

## 2019-03-28 ENCOUNTER — Emergency Department (HOSPITAL_BASED_OUTPATIENT_CLINIC_OR_DEPARTMENT_OTHER): Payer: Medicaid Other

## 2019-03-28 ENCOUNTER — Other Ambulatory Visit: Payer: Self-pay

## 2019-03-28 ENCOUNTER — Observation Stay (HOSPITAL_BASED_OUTPATIENT_CLINIC_OR_DEPARTMENT_OTHER)
Admission: EM | Admit: 2019-03-28 | Discharge: 2019-03-29 | Disposition: A | Discharge status: Home / Self Care | Payer: Medicaid Other | Attending: Internal Medicine | Admitting: Internal Medicine

## 2019-03-28 ENCOUNTER — Emergency Department (HOSPITAL_COMMUNITY): Payer: Medicaid Other

## 2019-03-28 DIAGNOSIS — R4781 Slurred speech: Secondary | ICD-10-CM | POA: Diagnosis not present

## 2019-03-28 DIAGNOSIS — Z20822 Contact with and (suspected) exposure to covid-19: Secondary | ICD-10-CM | POA: Diagnosis not present

## 2019-03-28 DIAGNOSIS — M549 Dorsalgia, unspecified: Secondary | ICD-10-CM | POA: Insufficient documentation

## 2019-03-28 DIAGNOSIS — I10 Essential (primary) hypertension: Secondary | ICD-10-CM | POA: Insufficient documentation

## 2019-03-28 DIAGNOSIS — H5462 Unqualified visual loss, left eye, normal vision right eye: Secondary | ICD-10-CM | POA: Diagnosis not present

## 2019-03-28 DIAGNOSIS — Z79899 Other long term (current) drug therapy: Secondary | ICD-10-CM | POA: Diagnosis not present

## 2019-03-28 DIAGNOSIS — H532 Diplopia: Secondary | ICD-10-CM | POA: Insufficient documentation

## 2019-03-28 DIAGNOSIS — G459 Transient cerebral ischemic attack, unspecified: Secondary | ICD-10-CM | POA: Diagnosis present

## 2019-03-28 DIAGNOSIS — G8929 Other chronic pain: Secondary | ICD-10-CM | POA: Insufficient documentation

## 2019-03-28 DIAGNOSIS — F17200 Nicotine dependence, unspecified, uncomplicated: Secondary | ICD-10-CM | POA: Insufficient documentation

## 2019-03-28 DIAGNOSIS — I739 Peripheral vascular disease, unspecified: Secondary | ICD-10-CM | POA: Diagnosis not present

## 2019-03-28 DIAGNOSIS — Z888 Allergy status to other drugs, medicaments and biological substances status: Secondary | ICD-10-CM | POA: Diagnosis not present

## 2019-03-28 DIAGNOSIS — Z881 Allergy status to other antibiotic agents status: Secondary | ICD-10-CM | POA: Insufficient documentation

## 2019-03-28 DIAGNOSIS — Z9049 Acquired absence of other specified parts of digestive tract: Secondary | ICD-10-CM | POA: Diagnosis not present

## 2019-03-28 DIAGNOSIS — I6782 Cerebral ischemia: Secondary | ICD-10-CM | POA: Insufficient documentation

## 2019-03-28 DIAGNOSIS — M419 Scoliosis, unspecified: Secondary | ICD-10-CM | POA: Insufficient documentation

## 2019-03-28 DIAGNOSIS — R29810 Facial weakness: Secondary | ICD-10-CM | POA: Diagnosis not present

## 2019-03-28 DIAGNOSIS — F419 Anxiety disorder, unspecified: Secondary | ICD-10-CM | POA: Diagnosis not present

## 2019-03-28 DIAGNOSIS — E785 Hyperlipidemia, unspecified: Secondary | ICD-10-CM | POA: Insufficient documentation

## 2019-03-28 DIAGNOSIS — R531 Weakness: Secondary | ICD-10-CM | POA: Diagnosis not present

## 2019-03-28 DIAGNOSIS — R200 Anesthesia of skin: Secondary | ICD-10-CM

## 2019-03-28 DIAGNOSIS — F329 Major depressive disorder, single episode, unspecified: Secondary | ICD-10-CM | POA: Insufficient documentation

## 2019-03-28 DIAGNOSIS — Z7982 Long term (current) use of aspirin: Secondary | ICD-10-CM | POA: Diagnosis not present

## 2019-03-28 DIAGNOSIS — Z880 Allergy status to penicillin: Secondary | ICD-10-CM | POA: Insufficient documentation

## 2019-03-28 HISTORY — DX: Scoliosis, unspecified: M41.9

## 2019-03-28 LAB — CBC
HCT: 43.8 % (ref 36.0–46.0)
HCT: 40 % (ref 36.0–46.0)
Hemoglobin: 14.1 g/dL (ref 12.0–15.0)
Hemoglobin: 13.1 g/dL (ref 12.0–15.0)
MCH: 28.9 pg (ref 26.0–34.0)
MCH: 28.9 pg (ref 26.0–34.0)
MCHC: 32.2 g/dL (ref 30.0–36.0)
MCHC: 32.8 g/dL (ref 30.0–36.0)
MCV: 89.8 fL (ref 80.0–100.0)
MCV: 88.1 fL (ref 80.0–100.0)
Platelets: 217 10*3/uL (ref 150–400)
Platelets: 204 10*3/uL (ref 150–400)
RBC: 4.88 MIL/uL (ref 3.87–5.11)
RBC: 4.54 MIL/uL (ref 3.87–5.11)
RDW: 13.6 % (ref 11.5–15.5)
RDW: 13.5 % (ref 11.5–15.5)
WBC: 6.5 10*3/uL (ref 4.0–10.5)
WBC: 6 10*3/uL (ref 4.0–10.5)
nRBC: 0 % (ref 0.0–0.2)
nRBC: 0 % (ref 0.0–0.2)

## 2019-03-28 LAB — BASIC METABOLIC PANEL
Anion gap: 9 (ref 5–15)
BUN: 9 mg/dL (ref 6–20)
CO2: 23 mmol/L (ref 22–32)
Calcium: 8.9 mg/dL (ref 8.9–10.3)
Chloride: 105 mmol/L (ref 98–111)
Creatinine, Ser: 0.68 mg/dL (ref 0.44–1.00)
GFR calc Af Amer: >60 mL/min (ref >60)
GFR calc non Af Amer: >60 mL/min (ref >60)
Glucose, Bld: 109 mg/dL — ABNORMAL HIGH (ref 70–99)
Potassium: 3.8 mmol/L (ref 3.5–5.1)
Sodium: 137 mmol/L (ref 135–145)

## 2019-03-28 LAB — URINALYSIS, ROUTINE W REFLEX MICROSCOPIC
Bilirubin Urine: NEGATIVE
Glucose, UA: NEGATIVE mg/dL
Hgb urine dipstick: NEGATIVE
Ketones, ur: NEGATIVE mg/dL
Leukocytes,Ua: NEGATIVE
Nitrite: NEGATIVE
Protein, ur: NEGATIVE mg/dL
Specific Gravity, Urine: 1.02 (ref 1.005–1.030)
pH: 6.5 (ref 5.0–8.0)

## 2019-03-28 LAB — CREATININE, SERUM
Creatinine, Ser: 0.71 mg/dL (ref 0.44–1.00)
GFR calc Af Amer: >60 mL/min (ref >60)
GFR calc non Af Amer: >60 mL/min (ref >60)

## 2019-03-28 LAB — HIV ANTIBODY (ROUTINE TESTING W REFLEX): HIV Screen 4th Generation wRfx: NONREACTIVE

## 2019-03-28 LAB — SARS CORONAVIRUS 2 (TAT 6-24 HRS): SARS Coronavirus 2: NEGATIVE

## 2019-03-28 LAB — PREGNANCY, URINE: Preg Test, Ur: NEGATIVE

## 2019-03-28 LAB — SARS CORONAVIRUS 2 AG (30 MIN TAT): SARS Coronavirus 2 Ag: NEGATIVE

## 2019-03-28 IMAGING — MR MR MRA NECK WO/W CM
16 of 19 series · 30 of 48 positions shown · IV contrast (gadavist)
Comparison: CTA head and neck [DATE]

CLINICAL DATA: Left-sided tingling and weakness for 5 days. Sudden
onset left eye vision loss with right eye diplopia.

EXAM:
MRI HEAD WITHOUT AND WITH CONTRAST
MRA HEAD WITHOUT CONTRAST
MRA NECK WITHOUT AND WITH CONTRAST
TECHNIQUE: Multiplanar, multiecho pulse sequences of the brain and surrounding
structures were obtained without and with intravenous contrast.
Angiographic images of the Circle of Willis were obtained using MRA
technique without intravenous contrast. Angiographic images of the
neck were obtained using MRA technique without and with intravenous
contrast. Carotid stenosis measurements (when applicable) are
obtained utilizing NASCET criteria, using the distal internal
carotid diameter as the denominator.
CONTRAST:  8mL GADAVIST GADOBUTROL 1 MMOL/ML IV SOLN

[Series 2: DWI · axial · 3.0mm · 0.94mm/px · z∈[-84,+70]mm · 5 of 106 slices shown (1 of 3)]
[im 1/106]
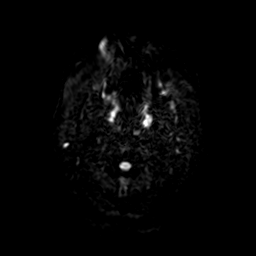
[im 27/106]
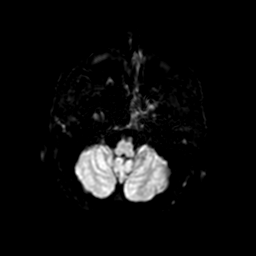
[im 53/106]
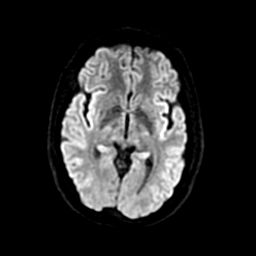
[im 79/106]
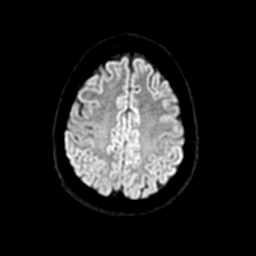
[im 106/106]
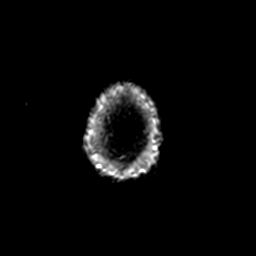

[Series 3: ax (id) · axial · 1.0mm · 0.43mm/px · z∈[-79,-27]mm · 5 of 184 slices shown]
[im 1/184]
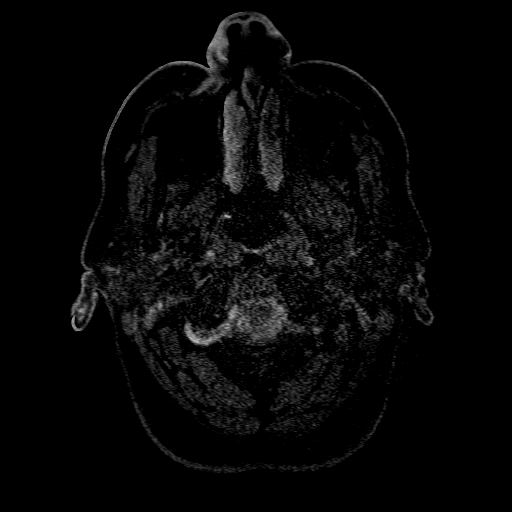
[im 27/184]
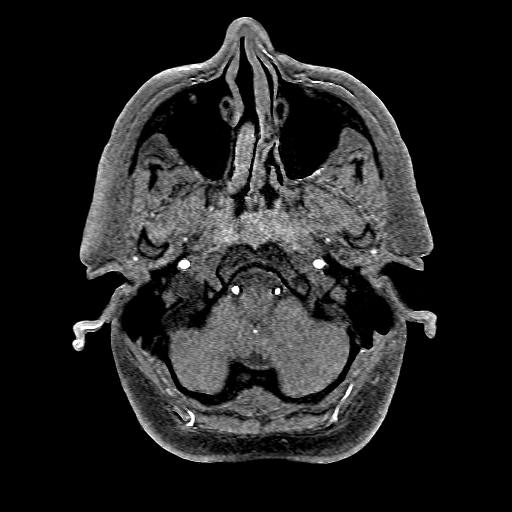
[im 53/184]
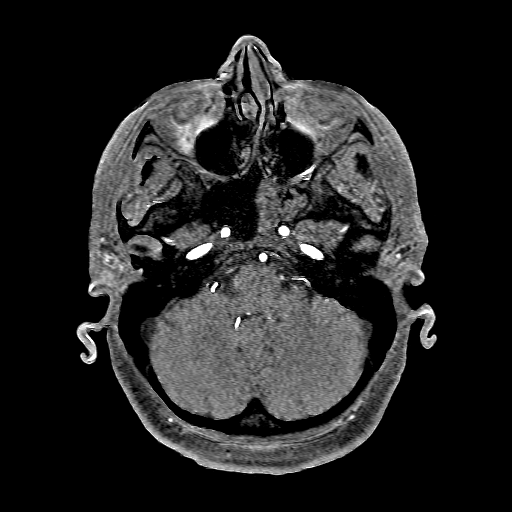
[im 79/184]
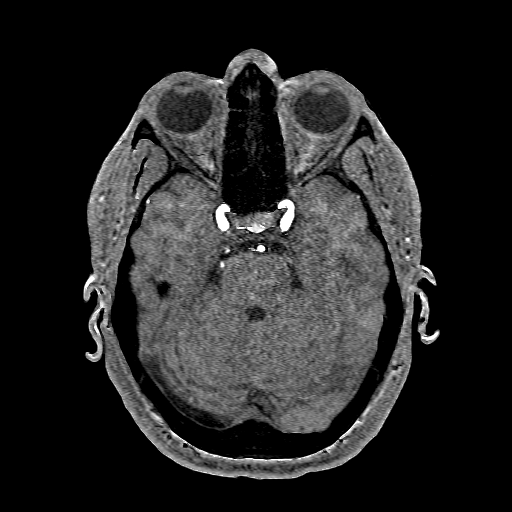
[im 105/184]
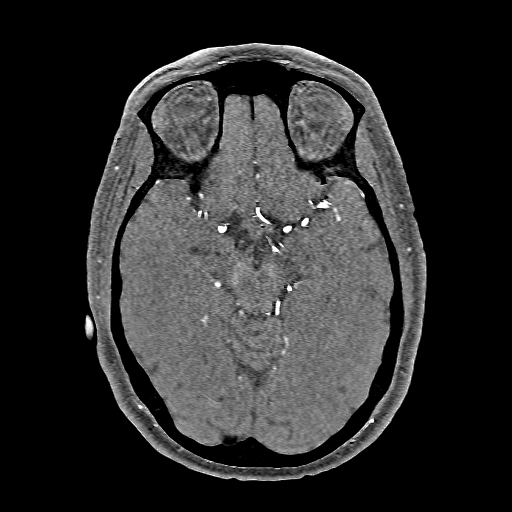

[Series 4: DWI · coronal · 4.0mm · 0.94mm/px · 3 of 74 slices shown (2 of 3)]
[im 1/74]
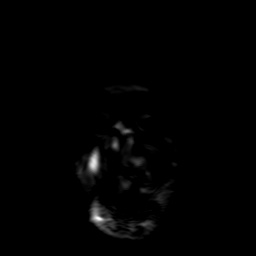
[im 37/74]
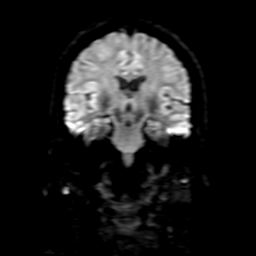
[im 74/74]
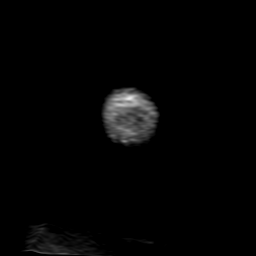

[Series 4: DWI · coronal · 4.0mm · 0.94mm/px · 3 of 74 slices shown (3 of 3)]
[im 1/74]
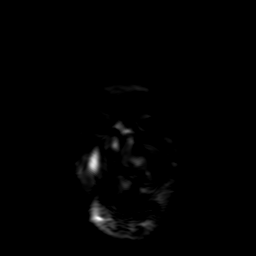
[im 37/74]
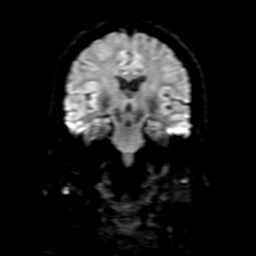
[im 74/74]
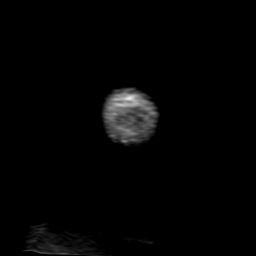

[Series 6: FLAIR · sagittal · 5.0mm · 0.47mm/px · 1 of 23 slices shown (1 of 2)]
[im 1/23]
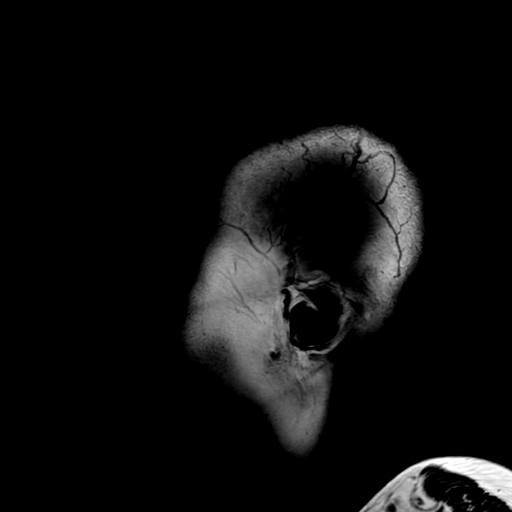

[Series 6: FLAIR · sagittal · 5.0mm · 0.47mm/px · 1 of 23 slices shown (2 of 2)]
[im 1/23]
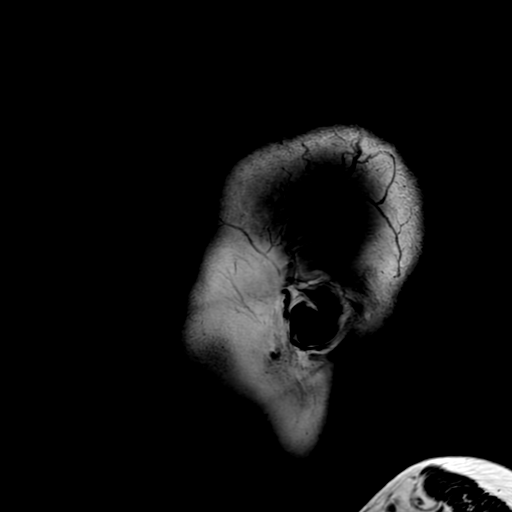

[Series 8: T2 · axial · 4.0mm · 0.47mm/px · 1 of 34 slices shown (1 of 2)]
[im 1/34]
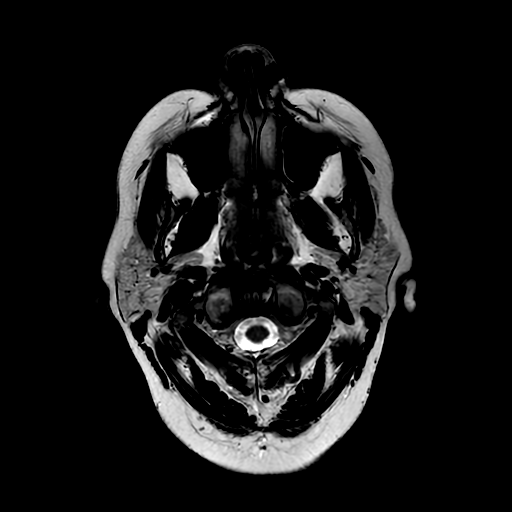

[Series 8: T2 · axial · 4.0mm · 0.47mm/px · 1 of 34 slices shown (2 of 2)]
[im 1/34]
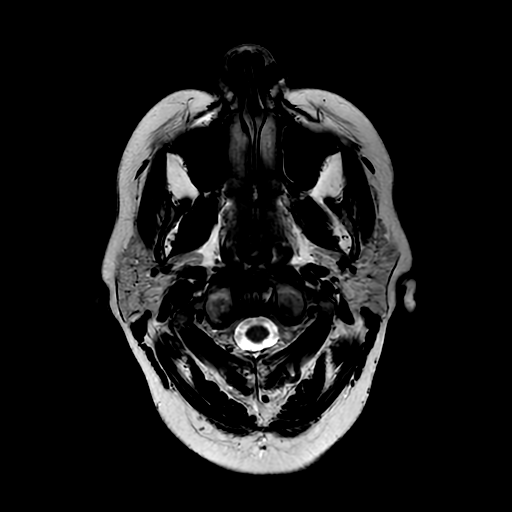

[Series 9: FLAIR fat-sat · axial · 4.0mm · 0.47mm/px · 1 of 34 slices shown (1 of 2)]
[im 1/34]
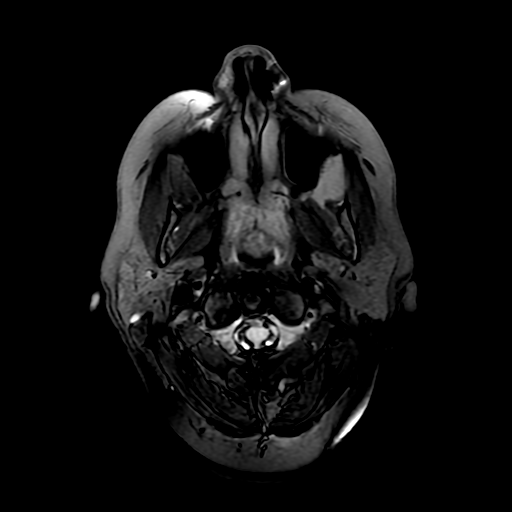

[Series 9: FLAIR fat-sat · axial · 4.0mm · 0.47mm/px · 1 of 34 slices shown (2 of 2)]
[im 1/34]
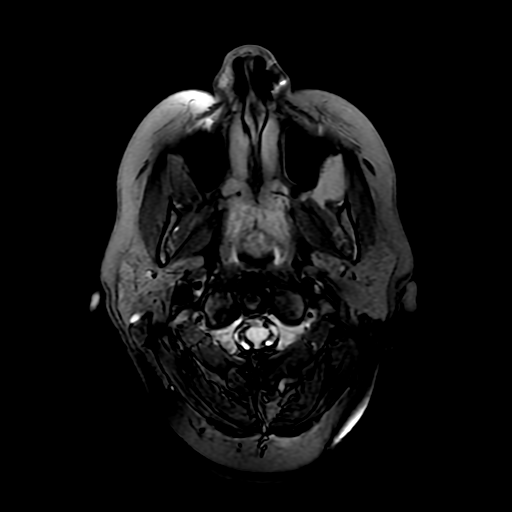

[Series 13: T2 post-contrast · coronal · 5.0mm · 0.39mm/px · 1 of 27 slices shown]
[im 1/27]
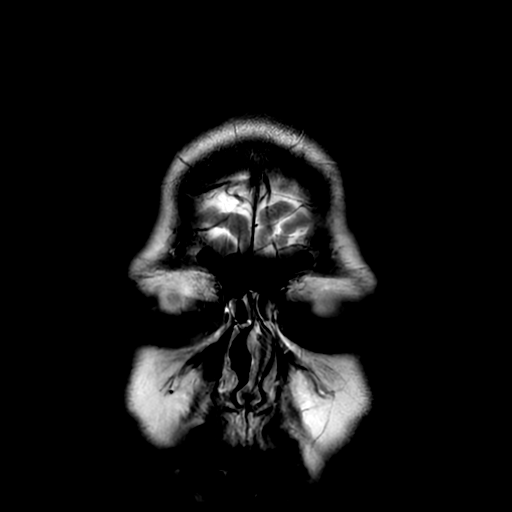

[Series 14: T1 · axial · 3.0mm · 1.00mm/px · z∈[-72,+68]mm · 2 of 48 slices shown (1 of 2)]
[im 1/48]
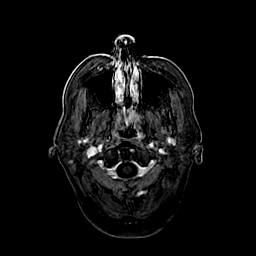
[im 48/48]
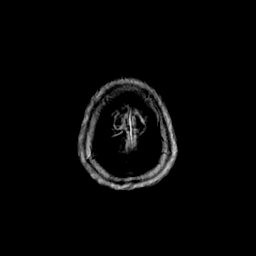

[Series 15: T1 · coronal · 5.0mm · 0.39mm/px · 1 of 27 slices shown (2 of 2)]
[im 1/27]
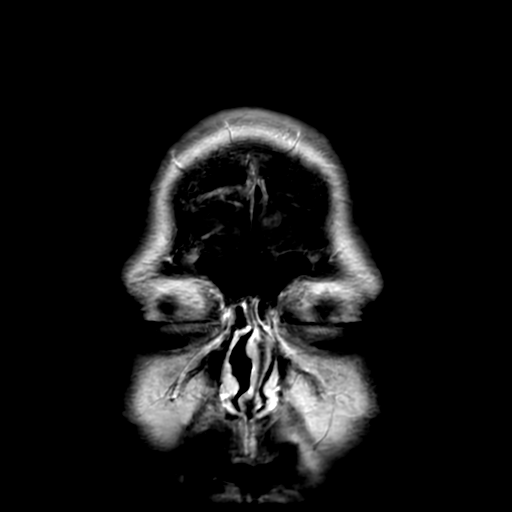

[Series 250: ADC · axial · 3.0mm · 0.94mm/px · z∈[-84,+70]mm · 2 of 53 slices shown (1 of 3)]
[im 1/53]
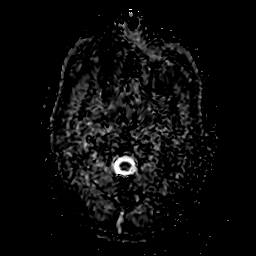
[im 53/53]
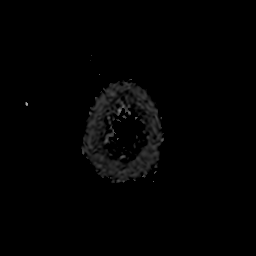

[Series 450: ADC · coronal · 4.0mm · 0.94mm/px · 1 of 37 slices shown (2 of 3)]
[im 1/37]
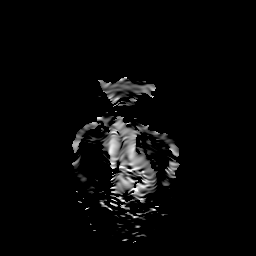

[Series 450: ADC · coronal · 4.0mm · 0.94mm/px · 1 of 37 slices shown (3 of 3)]
[im 1/37]
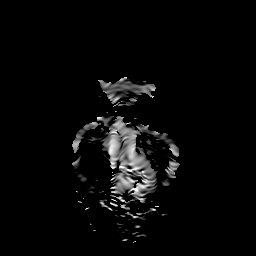

[30 of 48 positions shown; findings below may reference images not displayed]

FINDINGS: MRI HEAD FINDINGS

BRAIN: The midline structures are normal. There is no acute infarct,
acute hemorrhage or mass. The white matter signal is normal for the
patient's age. The CSF spaces are normal for age, with no
hydrocephalus. Blood-sensitive sequences show no chronic
microhemorrhage or superficial siderosis. No abnormal contrast
enhancement.

SKULL AND UPPER CERVICAL SPINE: The visualized skull base,
calvarium, upper cervical spine and extracranial soft tissues are
normal.

SINUSES/ORBITS: No fluid levels or advanced mucosal thickening. No
mastoid or middle ear effusion. The orbits are normal.

MRA HEAD FINDINGS

POSTERIOR CIRCULATION:

--Vertebral arteries: Normal right dominant configuration of V4
segments.

--Posterior inferior cerebellar arteries (PICA): Patent origins from
the vertebral arteries.

--Anterior inferior cerebellar arteries (AICA): Patent origins from
the basilar artery.

--Basilar artery: Normal.

--Superior cerebellar arteries: Normal.

--Posterior cerebral arteries: Normal. Both originate from the
basilar artery. Posterior communicating arteries (p-comm) are
diminutive or absent.

ANTERIOR CIRCULATION:

--Intracranial internal carotid arteries: Normal.

--Anterior cerebral arteries (ACA): Normal. Both A1 segments are
present. Patent anterior communicating artery (a-comm).

--Middle cerebral arteries (MCA): Normal.

MRA NECK FINDINGS

Aortic arch: Normal 3 vessel aortic branching pattern. The
visualized subclavian arteries are normal.

Right carotid system: Normal course and caliber without stenosis or
evidence of dissection.

Left carotid system: Normal course and caliber without stenosis or
evidence of dissection.

Vertebral arteries: Left dominant. Vertebral artery origins are
normal. Vertebral arteries are normal in course and caliber to the
vertebrobasilar confluence without stenosis or evidence of
dissection.
IMPRESSION: 1. Normal MRI of the brain.
2. Normal MRA of the head and neck.

## 2019-03-28 IMAGING — CT CT ANGIO HEAD
2 of 7 series · 8 of 33 positions shown · IV contrast (Omnipaque)
Comparison: Plain head CT [B9] hours today.

CLINICAL DATA: 43-year-old female with neurologic deficits
including loss of vision, double vision, left extremity numbness and
tingling.

EXAM:
CT ANGIOGRAPHY HEAD AND NECK
TECHNIQUE: Multidetector CT imaging of the head and neck was performed using
the standard protocol during bolus administration of intravenous
contrast. Multiplanar CT image reconstructions and MIPs were
obtained to evaluate the vascular anatomy. Carotid stenosis
measurements (when applicable) are obtained utilizing NASCET
criteria, using the distal internal carotid diameter as the
denominator.
CONTRAST:  100mL OMNIPAQUE IOHEXOL 350 MG/ML SOLN

[Series 8: sag soft · sagittal · 0.30mm/px · 1 of 51 slices shown]
[im 26/51  soft-tissue]
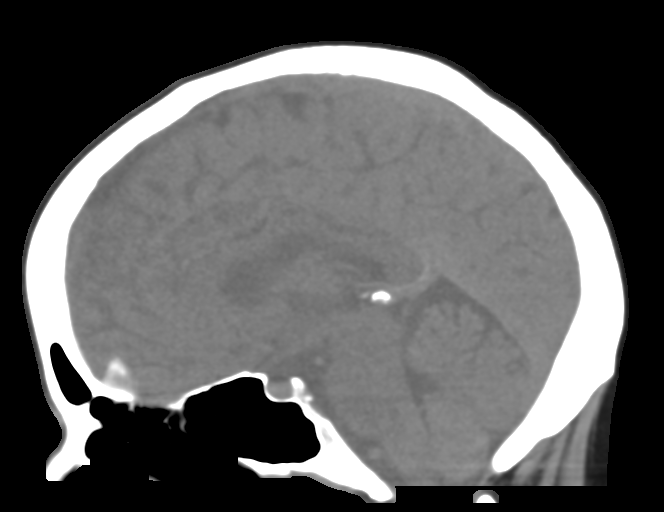

[Series 11: axial thin · axial · 0.39mm/px · z∈[-323,-48]mm · 7 of 367 slices shown]
[im 46/367  soft-tissue]
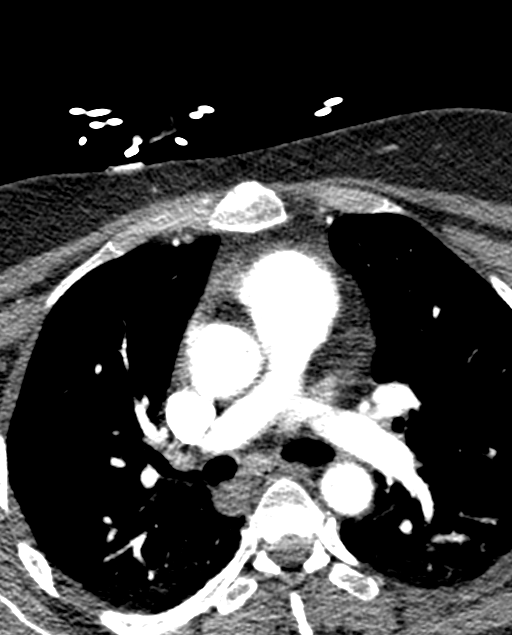
[im 92/367  bone]
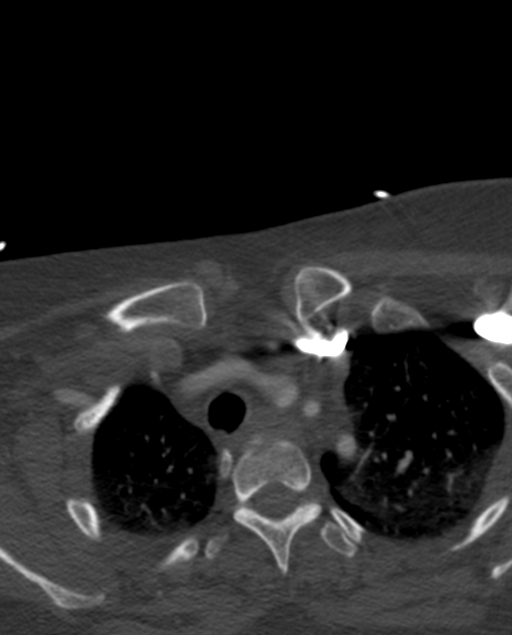
[im 138/367  soft-tissue]
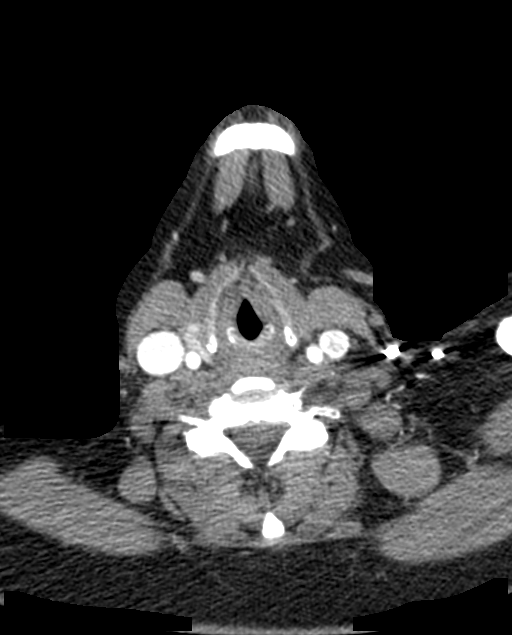
[im 184/367  bone]
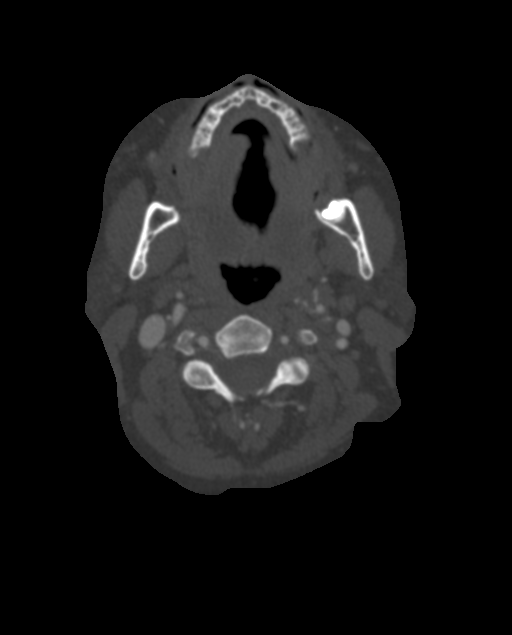
[im 229/367  soft-tissue]
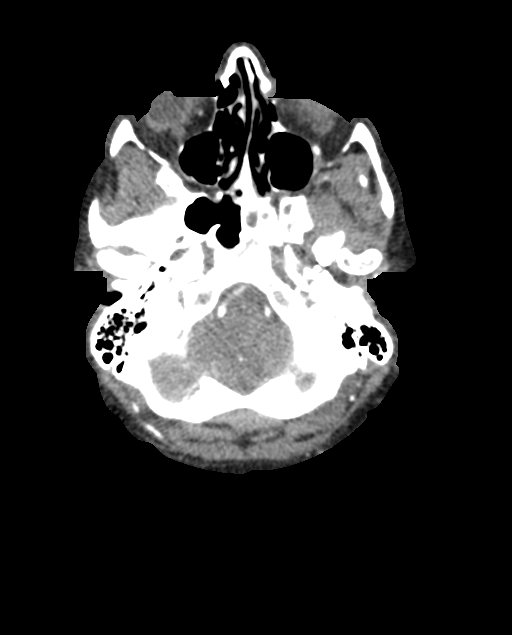
[im 275/367  bone]
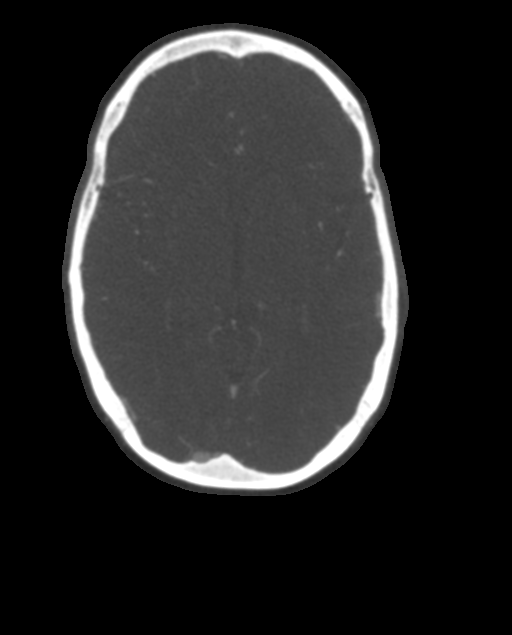
[im 321/367  soft-tissue]
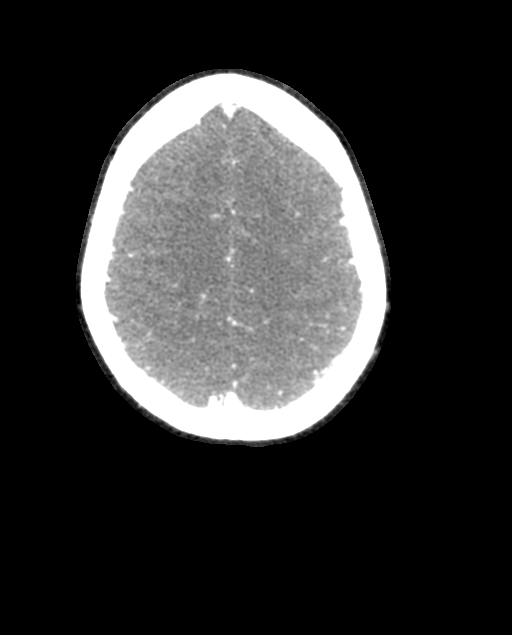

[8 of 33 positions shown; findings below may reference images not displayed]

FINDINGS: CT HEAD

Brain: Gray-white matter differentiation remains within normal
limits. Stable non contrast CT appearance of the brain. No acute
intracranial abnormality.

Calvarium and skull base: Negative.

Paranasal sinuses: Stable lobulated mucosal thickening in the left
sphenoid sinus. Other Visualized paranasal sinuses and mastoids are
stable and well pneumatized.

Orbits: Visualized orbits and scalp soft tissues are within normal
limits.

CTA NECK

Skeleton: Absent maxillary dentition. No acute osseous abnormality
identified.

Upper chest: Dependent ground-glass opacity in both lungs appear
symmetric and might be atelectasis. No superimposed mediastinal or
hilar lymphadenopathy, pleural or pericardial effusion.

Visible central pulmonary arteries appear patent.

Other neck: Negative.

Aortic arch: 3 vessel arch configuration with no arch
atherosclerosis.

Right carotid system: Negative. Mildly tortuous cervical right ICA.

Left carotid system: Negative.

Vertebral arteries:
Normal proximal right subclavian artery and right vertebral artery
origins. The right vertebral artery appears dominant and is patent
to the skull base without plaque or stenosis.

Normal proximal left subclavian artery and left vertebral artery
origin. The left vertebral artery is mildly non dominant and patent
to the skull base without plaque or stenosis.

CTA HEAD

Posterior circulation: No distal vertebral artery plaque or
stenosis. Normal PICA origins, vertebrobasilar junction. The right
V4 segment is dominant. Patent basilar artery without stenosis.
Normal SCA and PCA origins. Posterior communicating arteries are
diminutive or absent. Bilateral PCA branches are within normal
limits.

Anterior circulation: Both ICA siphons are patent. On the left there
is no siphon plaque or stenosis. Likewise the right siphon appears
patent and normal. The right ophthalmic artery origin appears normal
on series 13, image 87. Patent carotid termini. Normal MCA and ACA
origins. Anterior communicating artery and bilateral ACA branches
are within normal limits. Left MCA M1 bifurcates early without
stenosis. Right MCA M1 segment and bifurcation are patent without
stenosis. Bilateral MCA branches are within normal limits.

Venous sinuses: Patent.

Anatomic variants: Dominant right vertebral artery.

Review of the MIP images confirms the above findings
IMPRESSION: 1. No large vessel occlusion and normal arterial findings on CTA
Head and Neck.
2. Stable and negative CT appearance of the brain.
3. Bilateral pulmonary ground-glass opacity in the visible lungs is
favored to be atelectasis rather than alveolar edema or infection.
And otherwise negative visible upper chest.

## 2019-03-28 IMAGING — MR MR HEAD WO/W CM
14 of 20 series · 24 of 48 positions shown · IV contrast (gadavist)
Comparison: CTA head and neck [DATE]

CLINICAL DATA: Left-sided tingling and weakness for 5 days. Sudden
onset left eye vision loss with right eye diplopia.

EXAM:
MRI HEAD WITHOUT AND WITH CONTRAST
MRA HEAD WITHOUT CONTRAST
MRA NECK WITHOUT AND WITH CONTRAST
TECHNIQUE: Multiplanar, multiecho pulse sequences of the brain and surrounding
structures were obtained without and with intravenous contrast.
Angiographic images of the Circle of Willis were obtained using MRA
technique without intravenous contrast. Angiographic images of the
neck were obtained using MRA technique without and with intravenous
contrast. Carotid stenosis measurements (when applicable) are
obtained utilizing NASCET criteria, using the distal internal
carotid diameter as the denominator.
CONTRAST:  8mL GADAVIST GADOBUTROL 1 MMOL/ML IV SOLN

[Series 2: DWI · axial · 3.0mm · 0.94mm/px · z∈[-84,+70]mm · 2 of 106 slices shown (1 of 2)]
[im 1/106]
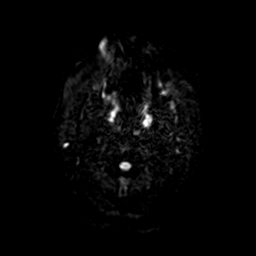
[im 106/106]
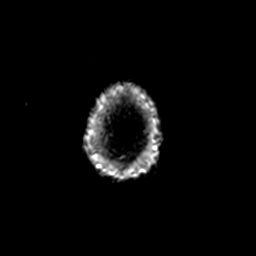

[Series 3: ax (id) · axial · 1.0mm · 0.43mm/px · z∈[-79,+12]mm · 4 of 184 slices shown]
[im 1/184]
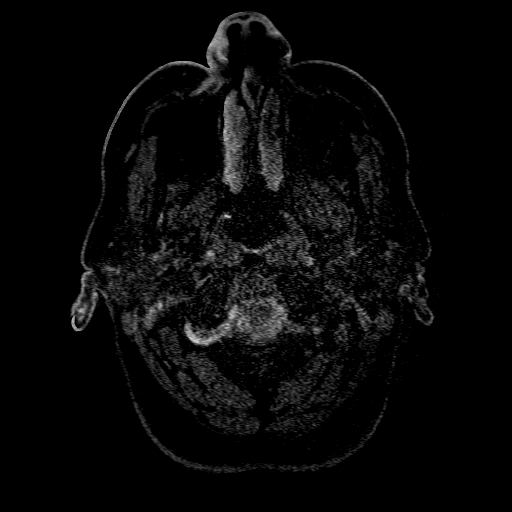
[im 62/184]
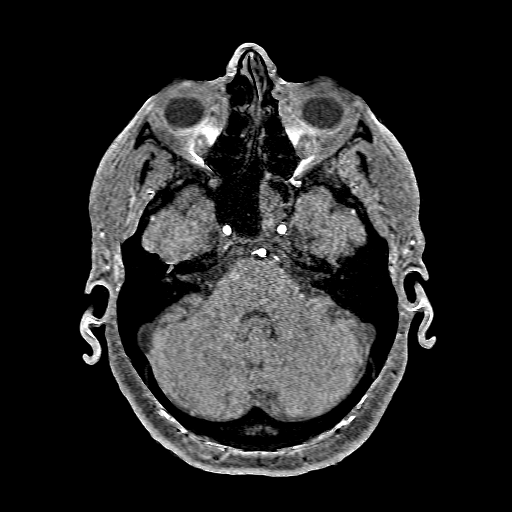
[im 123/184]
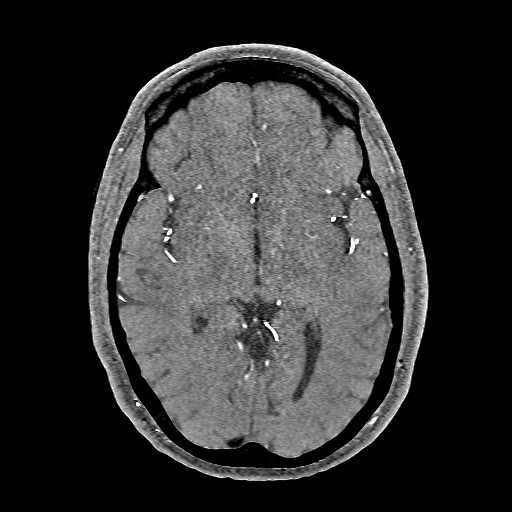
[im 184/184]
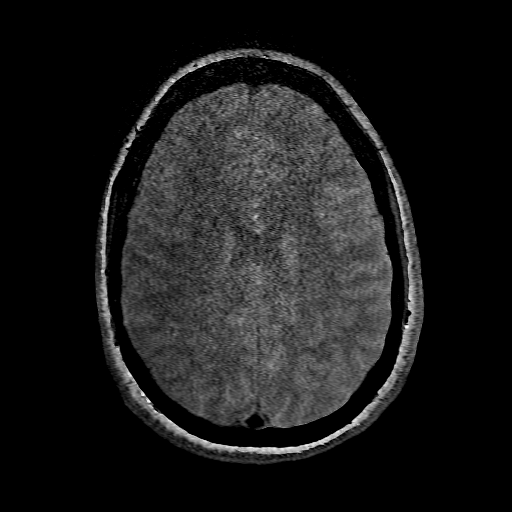

[Series 4: DWI · coronal · 4.0mm · 0.94mm/px · 2 of 74 slices shown (2 of 2)]
[im 1/74]
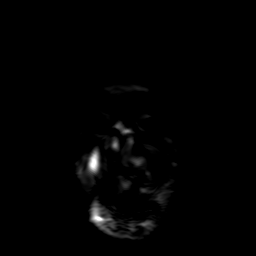
[im 74/74]
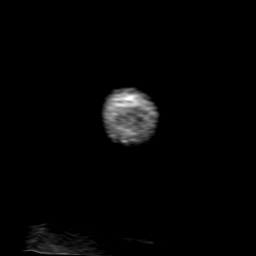

[Series 6: FLAIR · sagittal · 5.0mm · 0.47mm/px · 1 of 23 slices shown]
[im 1/23]
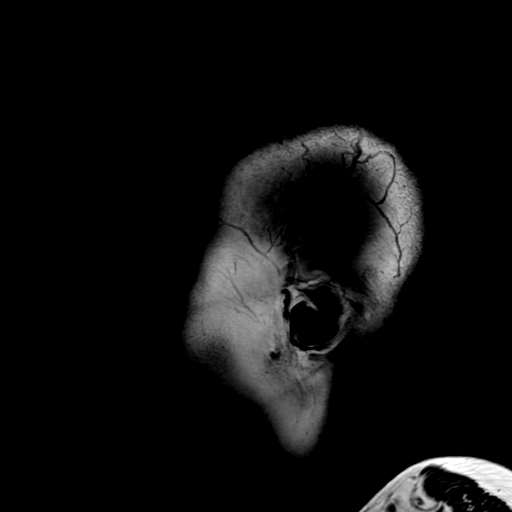

[Series 8: T2 · axial · 4.0mm · 0.47mm/px · 1 of 34 slices shown]
[im 1/34]
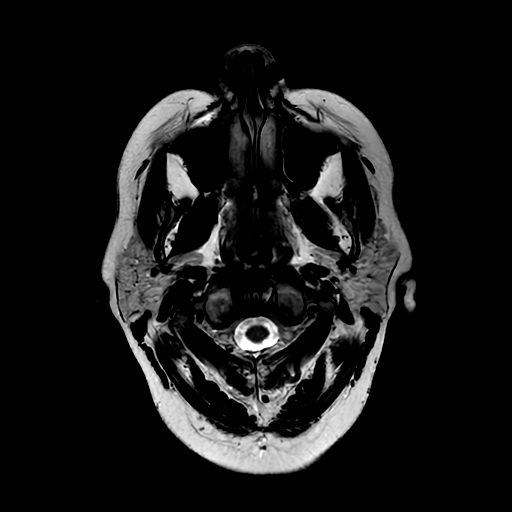

[Series 9: FLAIR fat-sat · axial · 4.0mm · 0.47mm/px · 1 of 34 slices shown]
[im 1/34]
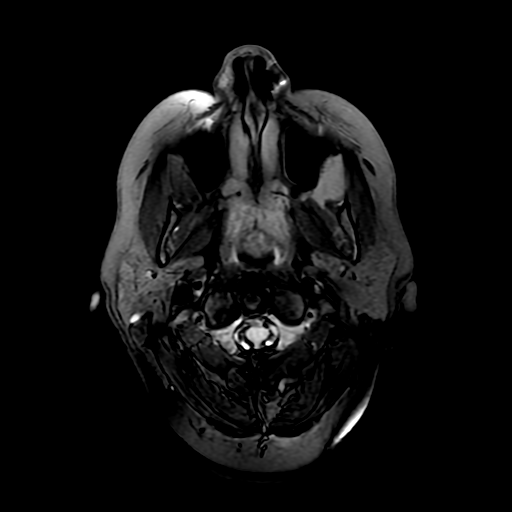

[Series 10: SWI · axial · 3.0mm · 0.50mm/px · z∈[-75,+72]mm · 3 of 100 slices shown (1 of 2)]
[im 1/100]
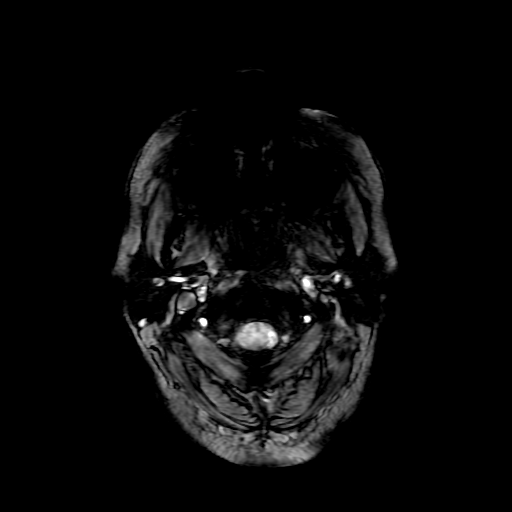
[im 50/100]
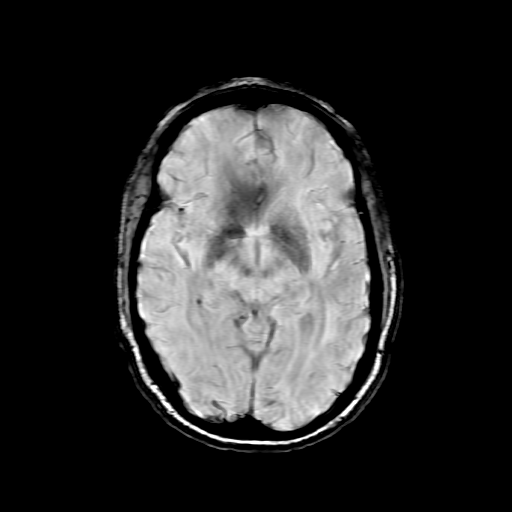
[im 100/100]
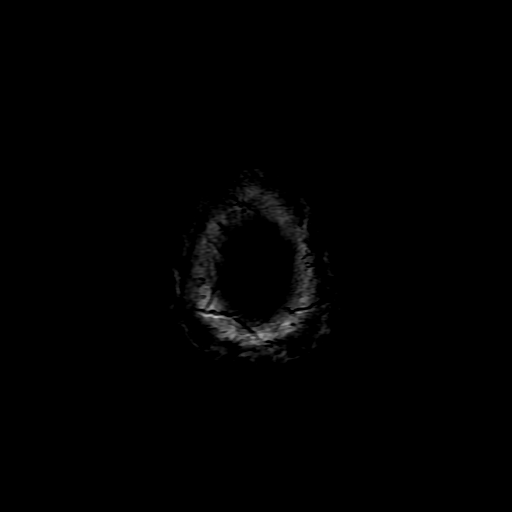

[Series 11: T1 · axial · non-contrast · 3.0mm · 1.00mm/px · 1 of 48 slices shown (1 of 3)]
[im 1/48]
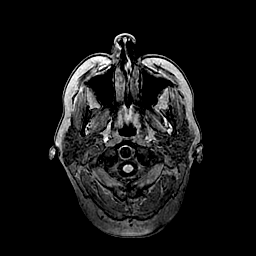

[Series 13: T2 post-contrast · coronal · 5.0mm · 0.39mm/px · 1 of 27 slices shown]
[im 1/27]
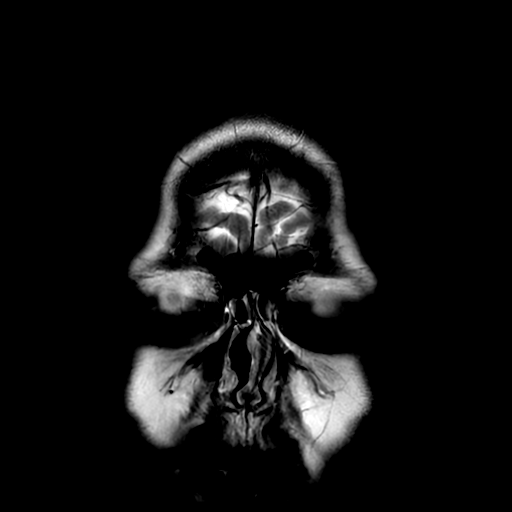

[Series 14: T1 · axial · 3.0mm · 1.00mm/px · 1 of 48 slices shown (2 of 3)]
[im 1/48]
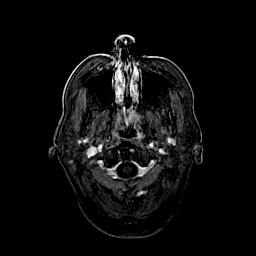

[Series 15: T1 · coronal · 5.0mm · 0.39mm/px · 1 of 27 slices shown (3 of 3)]
[im 1/27]
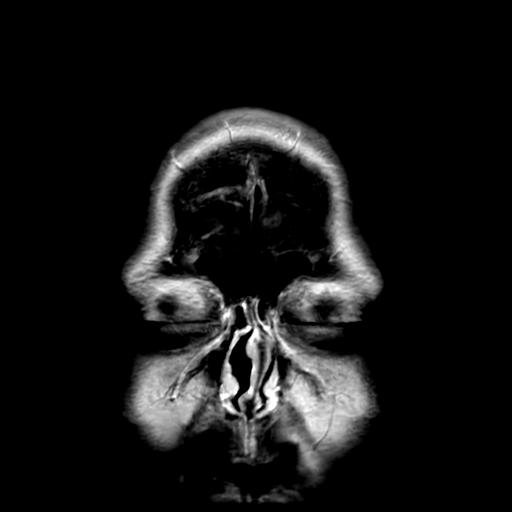

[Series 250: ADC · axial · 3.0mm · 0.94mm/px · z∈[-84,+70]mm · 2 of 53 slices shown (1 of 2)]
[im 1/53]
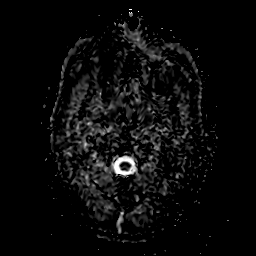
[im 53/53]
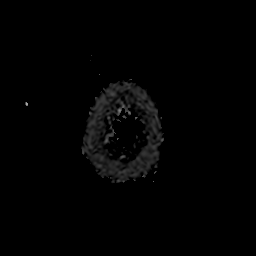

[Series 450: ADC · coronal · 4.0mm · 0.94mm/px · 1 of 37 slices shown (2 of 2)]
[im 1/37]
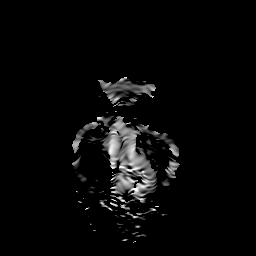

[Series 1000: SWI · axial · 3.0mm · 0.50mm/px · z∈[-75,+72]mm · 3 of 100 slices shown (2 of 2)]
[im 1/100]
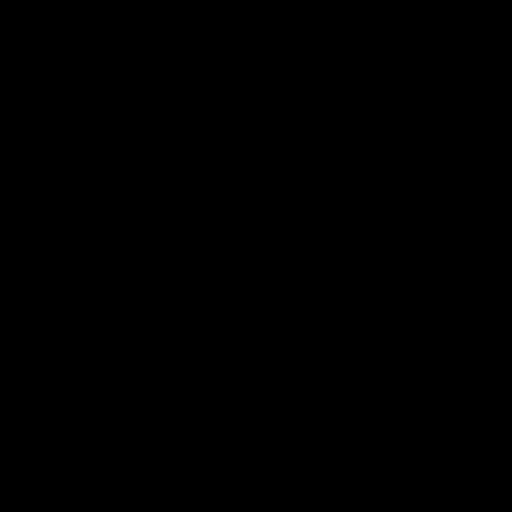
[im 50/100]
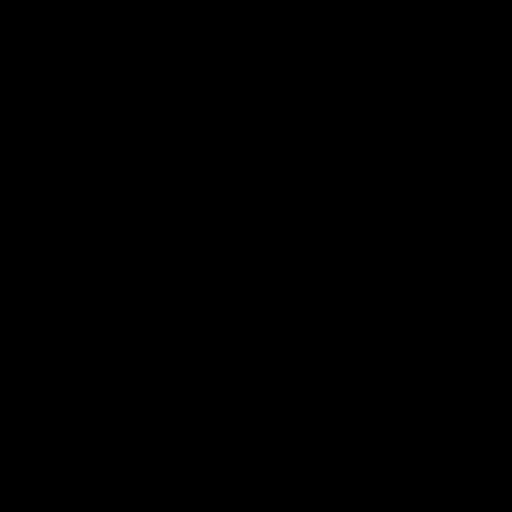
[im 100/100]
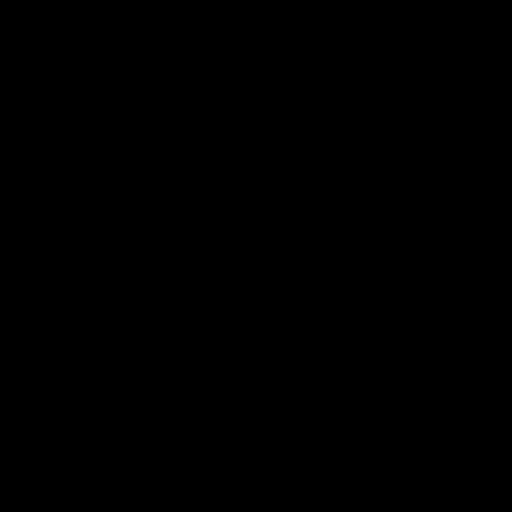

[24 of 48 positions shown; findings below may reference images not displayed]

FINDINGS: MRI HEAD FINDINGS

BRAIN: The midline structures are normal. There is no acute infarct,
acute hemorrhage or mass. The white matter signal is normal for the
patient's age. The CSF spaces are normal for age, with no
hydrocephalus. Blood-sensitive sequences show no chronic
microhemorrhage or superficial siderosis. No abnormal contrast
enhancement.

SKULL AND UPPER CERVICAL SPINE: The visualized skull base,
calvarium, upper cervical spine and extracranial soft tissues are
normal.

SINUSES/ORBITS: No fluid levels or advanced mucosal thickening. No
mastoid or middle ear effusion. The orbits are normal.

MRA HEAD FINDINGS

POSTERIOR CIRCULATION:

--Vertebral arteries: Normal right dominant configuration of V4
segments.

--Posterior inferior cerebellar arteries (PICA): Patent origins from
the vertebral arteries.

--Anterior inferior cerebellar arteries (AICA): Patent origins from
the basilar artery.

--Basilar artery: Normal.

--Superior cerebellar arteries: Normal.

--Posterior cerebral arteries: Normal. Both originate from the
basilar artery. Posterior communicating arteries (p-comm) are
diminutive or absent.

ANTERIOR CIRCULATION:

--Intracranial internal carotid arteries: Normal.

--Anterior cerebral arteries (ACA): Normal. Both A1 segments are
present. Patent anterior communicating artery (a-comm).

--Middle cerebral arteries (MCA): Normal.

MRA NECK FINDINGS

Aortic arch: Normal 3 vessel aortic branching pattern. The
visualized subclavian arteries are normal.

Right carotid system: Normal course and caliber without stenosis or
evidence of dissection.

Left carotid system: Normal course and caliber without stenosis or
evidence of dissection.

Vertebral arteries: Left dominant. Vertebral artery origins are
normal. Vertebral arteries are normal in course and caliber to the
vertebrobasilar confluence without stenosis or evidence of
dissection.
IMPRESSION: 1. Normal MRI of the brain.
2. Normal MRA of the head and neck.

## 2019-03-28 IMAGING — CT CT ANGIO NECK
1 of 11 series · 5 of 33 positions shown · IV contrast (Omnipaque)
Comparison: Plain head CT [B9] hours today.

CLINICAL DATA: 43-year-old female with neurologic deficits
including loss of vision, double vision, left extremity numbness and
tingling.

EXAM:
CT ANGIOGRAPHY HEAD AND NECK
TECHNIQUE: Multidetector CT imaging of the head and neck was performed using
the standard protocol during bolus administration of intravenous
contrast. Multiplanar CT image reconstructions and MIPs were
obtained to evaluate the vascular anatomy. Carotid stenosis
measurements (when applicable) are obtained utilizing NASCET
criteria, using the distal internal carotid diameter as the
denominator.
CONTRAST:  100mL OMNIPAQUE IOHEXOL 350 MG/ML SOLN

[Series 11: axial thin · axial · 0.39mm/px · z∈[-307,-63]mm · 5 of 367 slices shown]
[im 62/367  soft-tissue]
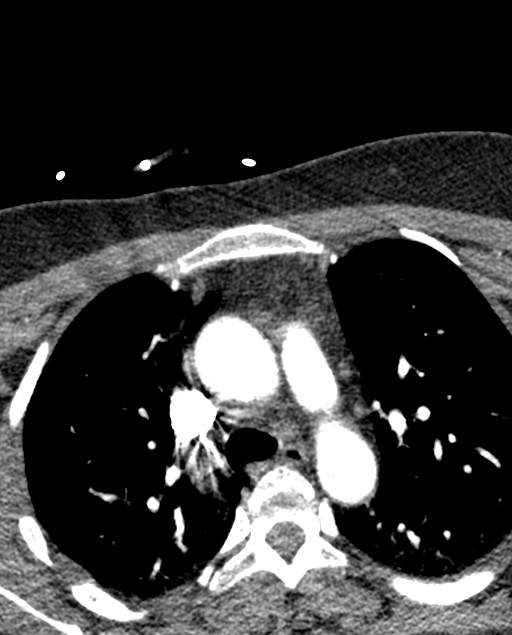
[im 123/367  bone]
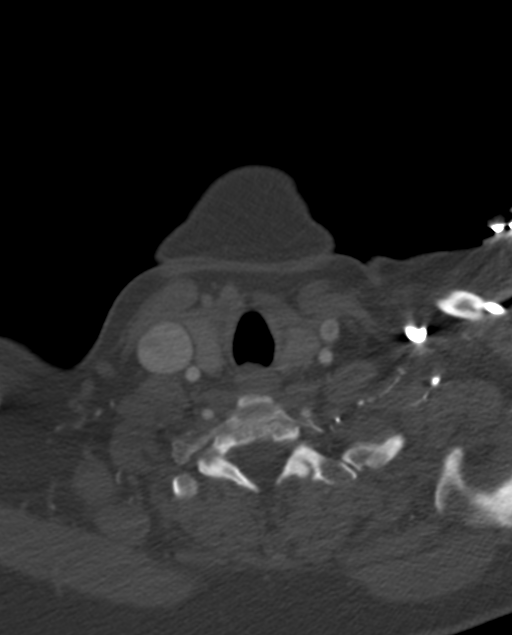
[im 184/367  soft-tissue]
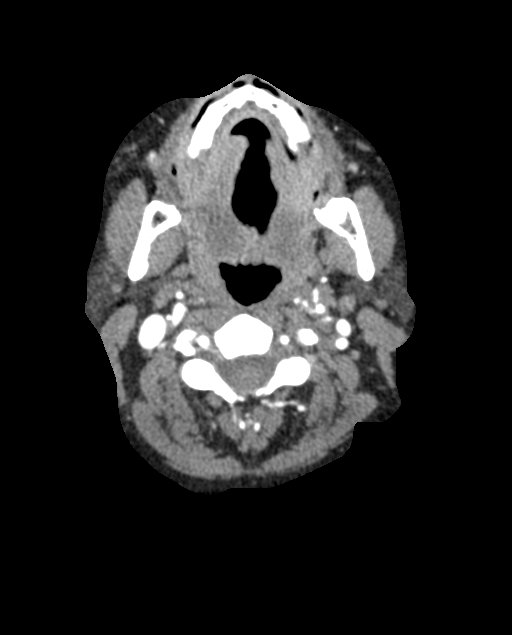
[im 245/367  bone]
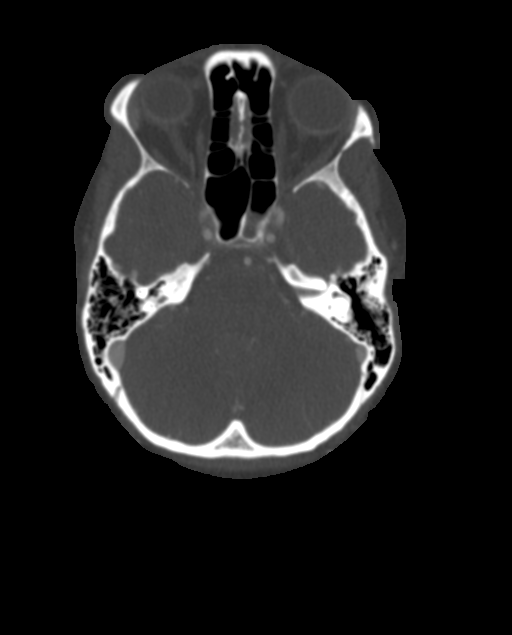
[im 306/367  soft-tissue]
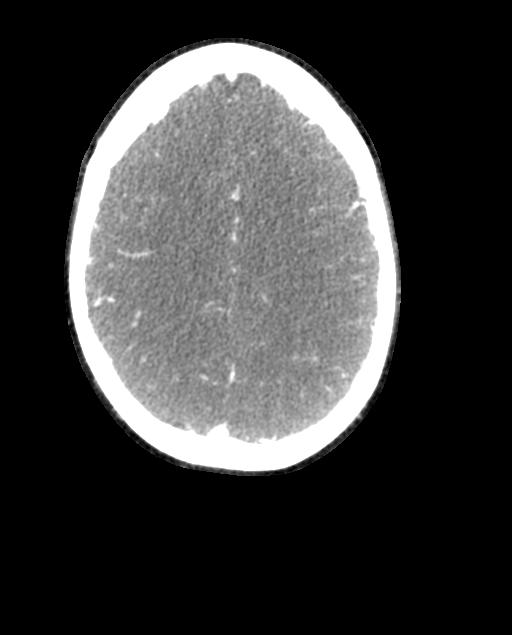

[5 of 33 positions shown; findings below may reference images not displayed]

FINDINGS: CT HEAD

Brain: Gray-white matter differentiation remains within normal
limits. Stable non contrast CT appearance of the brain. No acute
intracranial abnormality.

Calvarium and skull base: Negative.

Paranasal sinuses: Stable lobulated mucosal thickening in the left
sphenoid sinus. Other Visualized paranasal sinuses and mastoids are
stable and well pneumatized.

Orbits: Visualized orbits and scalp soft tissues are within normal
limits.

CTA NECK

Skeleton: Absent maxillary dentition. No acute osseous abnormality
identified.

Upper chest: Dependent ground-glass opacity in both lungs appear
symmetric and might be atelectasis. No superimposed mediastinal or
hilar lymphadenopathy, pleural or pericardial effusion.

Visible central pulmonary arteries appear patent.

Other neck: Negative.

Aortic arch: 3 vessel arch configuration with no arch
atherosclerosis.

Right carotid system: Negative. Mildly tortuous cervical right ICA.

Left carotid system: Negative.

Vertebral arteries:
Normal proximal right subclavian artery and right vertebral artery
origins. The right vertebral artery appears dominant and is patent
to the skull base without plaque or stenosis.

Normal proximal left subclavian artery and left vertebral artery
origin. The left vertebral artery is mildly non dominant and patent
to the skull base without plaque or stenosis.

CTA HEAD

Posterior circulation: No distal vertebral artery plaque or
stenosis. Normal PICA origins, vertebrobasilar junction. The right
V4 segment is dominant. Patent basilar artery without stenosis.
Normal SCA and PCA origins. Posterior communicating arteries are
diminutive or absent. Bilateral PCA branches are within normal
limits.

Anterior circulation: Both ICA siphons are patent. On the left there
is no siphon plaque or stenosis. Likewise the right siphon appears
patent and normal. The right ophthalmic artery origin appears normal
on series 13, image 87. Patent carotid termini. Normal MCA and ACA
origins. Anterior communicating artery and bilateral ACA branches
are within normal limits. Left MCA M1 bifurcates early without
stenosis. Right MCA M1 segment and bifurcation are patent without
stenosis. Bilateral MCA branches are within normal limits.

Venous sinuses: Patent.

Anatomic variants: Dominant right vertebral artery.

Review of the MIP images confirms the above findings
IMPRESSION: 1. No large vessel occlusion and normal arterial findings on CTA
Head and Neck.
2. Stable and negative CT appearance of the brain.
3. Bilateral pulmonary ground-glass opacity in the visible lungs is
favored to be atelectasis rather than alveolar edema or infection.
And otherwise negative visible upper chest.

## 2019-03-28 IMAGING — MR MR MRA HEAD W/O CM
16 of 20 series · 20 of 48 positions shown · IV contrast (gadavist)
Comparison: CTA head and neck [DATE]

CLINICAL DATA: Left-sided tingling and weakness for 5 days. Sudden
onset left eye vision loss with right eye diplopia.

EXAM:
MRI HEAD WITHOUT AND WITH CONTRAST
MRA HEAD WITHOUT CONTRAST
MRA NECK WITHOUT AND WITH CONTRAST
TECHNIQUE: Multiplanar, multiecho pulse sequences of the brain and surrounding
structures were obtained without and with intravenous contrast.
Angiographic images of the Circle of Willis were obtained using MRA
technique without intravenous contrast. Angiographic images of the
neck were obtained using MRA technique without and with intravenous
contrast. Carotid stenosis measurements (when applicable) are
obtained utilizing NASCET criteria, using the distal internal
carotid diameter as the denominator.
CONTRAST:  8mL GADAVIST GADOBUTROL 1 MMOL/ML IV SOLN

[Series 2: DWI · axial · 3.0mm · 0.94mm/px · 1 of 106 slices shown (1 of 2)]
[im 106/106]
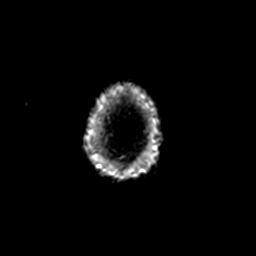

[Series 3: ax (id) · axial · 1.0mm · 0.43mm/px · 1 of 184 slices shown]
[im 62/184]
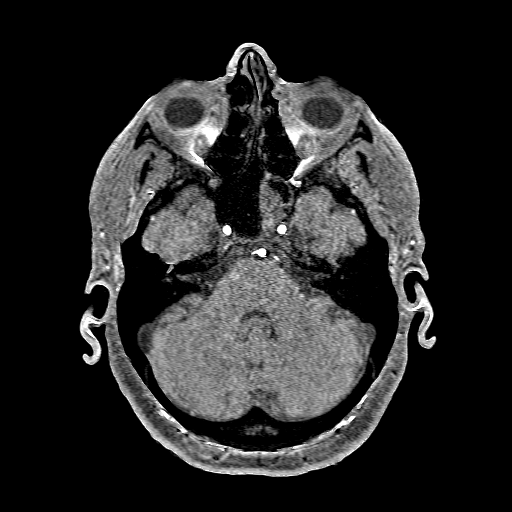

[Series 4: DWI · coronal · 4.0mm · 0.94mm/px · 1 of 74 slices shown (2 of 2)]
[im 1/74]
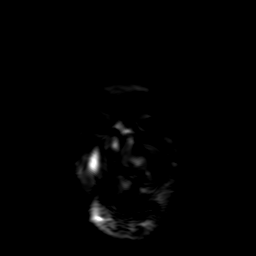

[Series 6: FLAIR · sagittal · 5.0mm · 0.47mm/px · 1 of 23 slices shown]
[im 1/23]
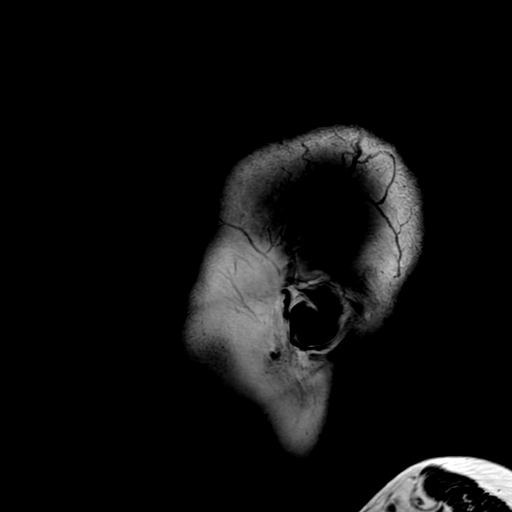

[Series 7: TOF · axial · 2.4mm · 0.47mm/px · 1 of 136 slices shown]
[im 46/136]
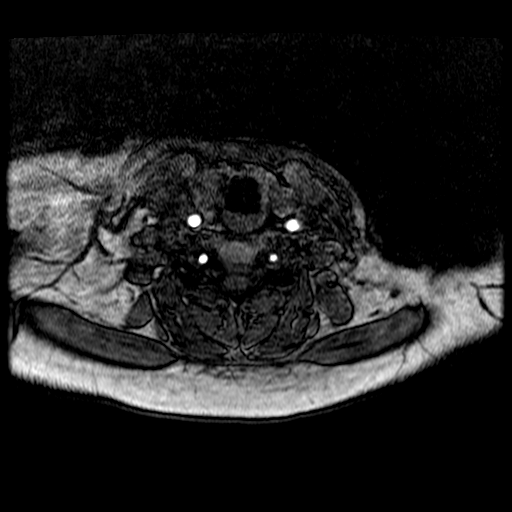

[Series 8: T2 · axial · 4.0mm · 0.47mm/px · 1 of 34 slices shown]
[im 1/34]
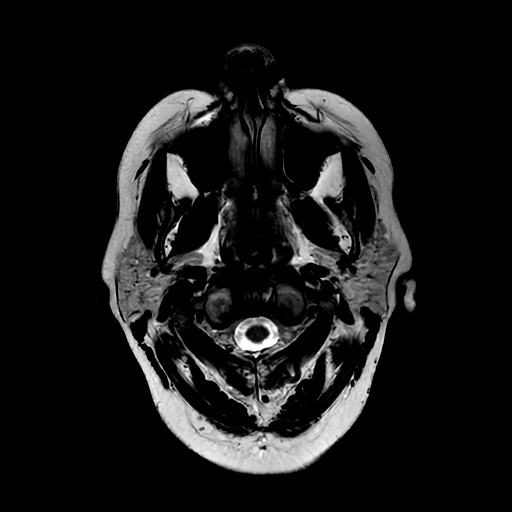

[Series 10: SWI · axial · 3.0mm · 0.50mm/px · 1 of 100 slices shown (1 of 2)]
[im 1/100]
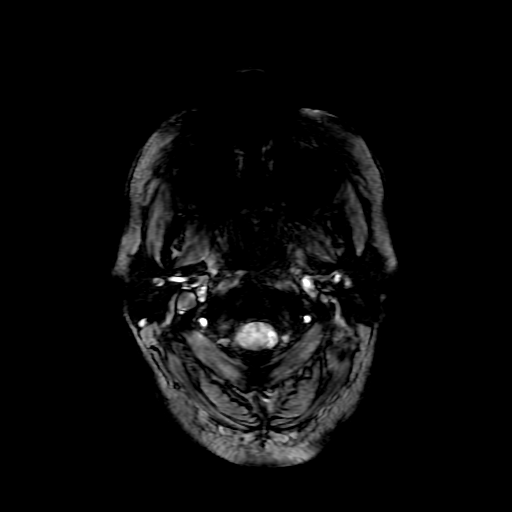

[Series 11: T1 · axial · non-contrast · 3.0mm · 1.00mm/px · 1 of 48 slices shown (1 of 2)]
[im 1/48]
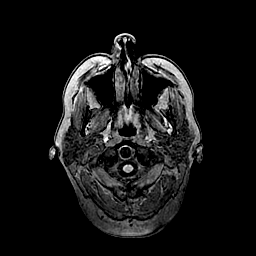

[Series 14: T1 · axial · 3.0mm · 1.00mm/px · 1 of 48 slices shown (2 of 2)]
[im 1/48]
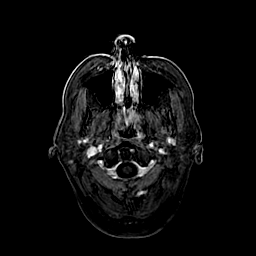

[Series 250: ADC · axial · 3.0mm · 0.94mm/px · 1 of 53 slices shown]
[im 1/53]
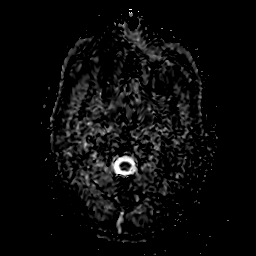

[Series 1000: SWI · axial · 3.0mm · 0.50mm/px · z∈[-75,+72]mm · 2 of 100 slices shown (2 of 2)]
[im 1/100]
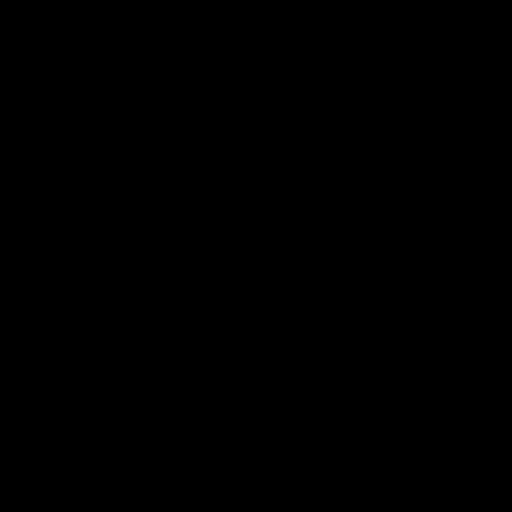
[im 100/100]
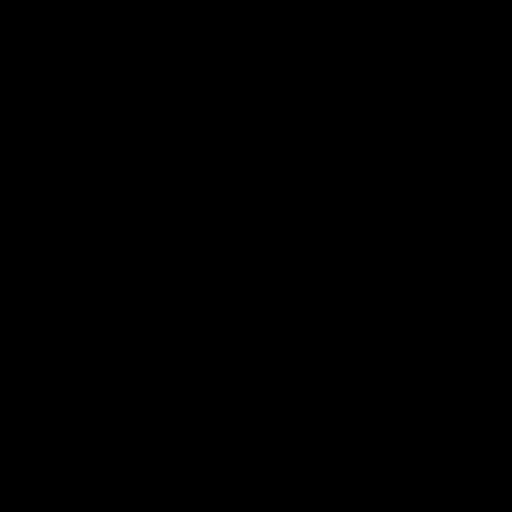

[Series 1200: cor cemra ft · coronal · 1.2mm · 0.59mm/px · 1 of 129 slices shown]
[im 86/129]
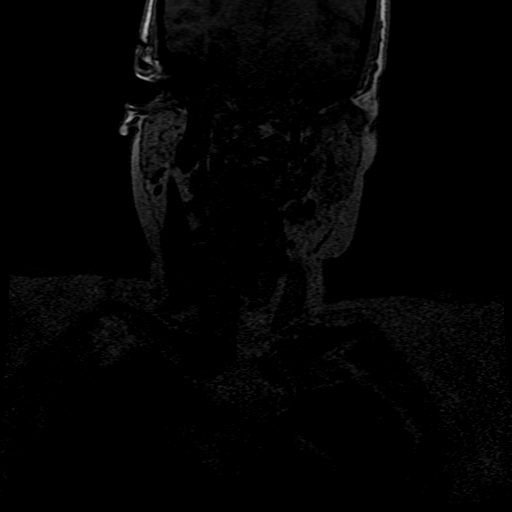

[Series 1201: ph1/cor cemra ft · coronal · 1.2mm · 0.59mm/px · 2 of 128 slices shown]
[im 1/128]
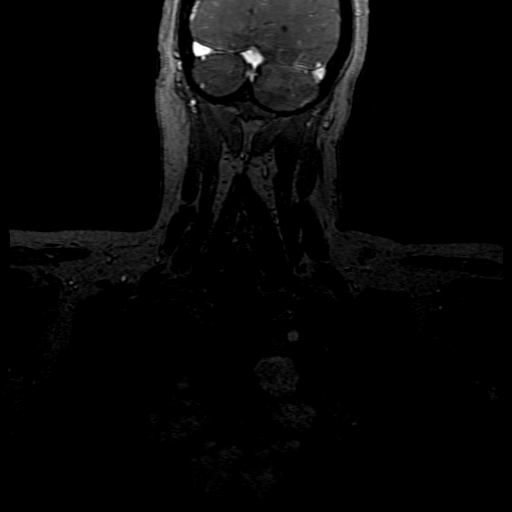
[im 85/128]
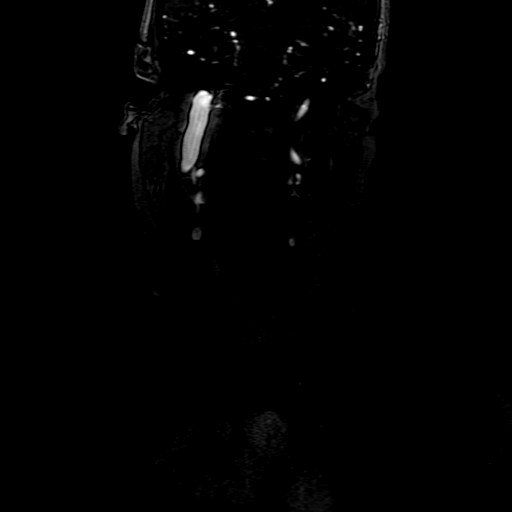

[Series 1202: ph2/cor cemra ft · coronal · 1.2mm · 0.59mm/px · 2 of 128 slices shown]
[im 43/128]
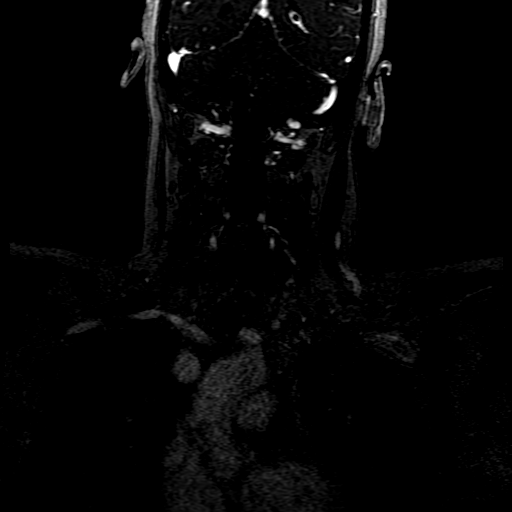
[im 128/128]
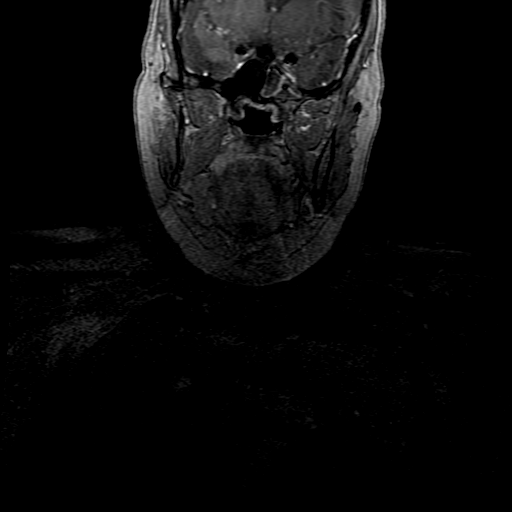

[((date))-((date)) · coronal · 1.2mm · 0.59mm/px · 1 of 129 slices shown (1 of 2)]
[im 86/129]
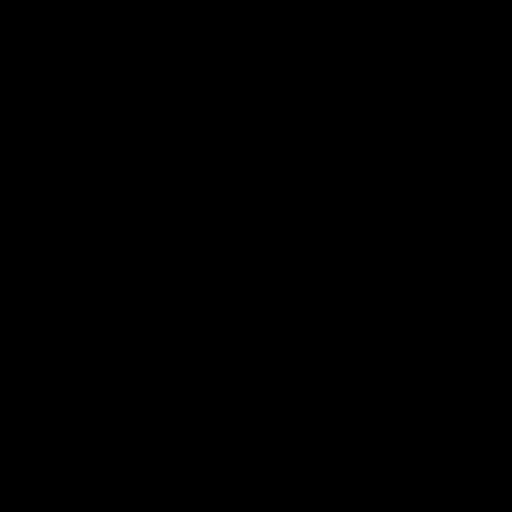

[((date))-((date)) · coronal · 1.2mm · 0.59mm/px · 2 of 128 slices shown (2 of 2)]
[im 1/128]
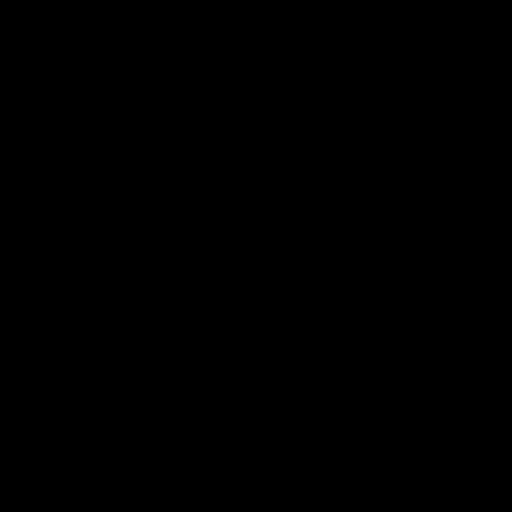
[im 85/128]
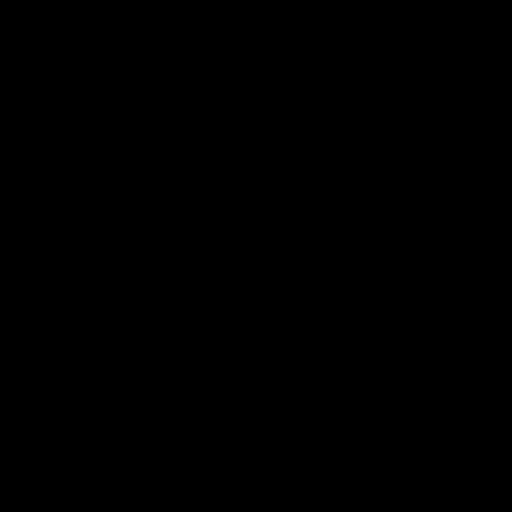

[20 of 48 positions shown; findings below may reference images not displayed]

FINDINGS: MRI HEAD FINDINGS

BRAIN: The midline structures are normal. There is no acute infarct,
acute hemorrhage or mass. The white matter signal is normal for the
patient's age. The CSF spaces are normal for age, with no
hydrocephalus. Blood-sensitive sequences show no chronic
microhemorrhage or superficial siderosis. No abnormal contrast
enhancement.

SKULL AND UPPER CERVICAL SPINE: The visualized skull base,
calvarium, upper cervical spine and extracranial soft tissues are
normal.

SINUSES/ORBITS: No fluid levels or advanced mucosal thickening. No
mastoid or middle ear effusion. The orbits are normal.

MRA HEAD FINDINGS

POSTERIOR CIRCULATION:

--Vertebral arteries: Normal right dominant configuration of V4
segments.

--Posterior inferior cerebellar arteries (PICA): Patent origins from
the vertebral arteries.

--Anterior inferior cerebellar arteries (AICA): Patent origins from
the basilar artery.

--Basilar artery: Normal.

--Superior cerebellar arteries: Normal.

--Posterior cerebral arteries: Normal. Both originate from the
basilar artery. Posterior communicating arteries (p-comm) are
diminutive or absent.

ANTERIOR CIRCULATION:

--Intracranial internal carotid arteries: Normal.

--Anterior cerebral arteries (ACA): Normal. Both A1 segments are
present. Patent anterior communicating artery (a-comm).

--Middle cerebral arteries (MCA): Normal.

MRA NECK FINDINGS

Aortic arch: Normal 3 vessel aortic branching pattern. The
visualized subclavian arteries are normal.

Right carotid system: Normal course and caliber without stenosis or
evidence of dissection.

Left carotid system: Normal course and caliber without stenosis or
evidence of dissection.

Vertebral arteries: Left dominant. Vertebral artery origins are
normal. Vertebral arteries are normal in course and caliber to the
vertebrobasilar confluence without stenosis or evidence of
dissection.
IMPRESSION: 1. Normal MRI of the brain.
2. Normal MRA of the head and neck.

## 2019-03-28 IMAGING — CT CT HEAD W/O CM
3 series · 15 of 47 positions shown, 18 images · non-contrast
Comparison: No pertinent prior studies available for comparison.

CLINICAL DATA: Focal neuro deficit, greater than 6 hours, stroke
suspected. Additional history provided: Loss vision on [REDACTED],
headache behind left eye, double vision right eye, left arm and left
leg numbness.

EXAM:
CT HEAD WITHOUT CONTRAST
TECHNIQUE: Contiguous axial images were obtained from the base of the skull
through the vertex without intravenous contrast.

[Series 2: head wo · axial · 0.41mm/px · z∈[-176,-51]mm · 9 of 31 slices shown, 12 images]
[im 3/31  brain]
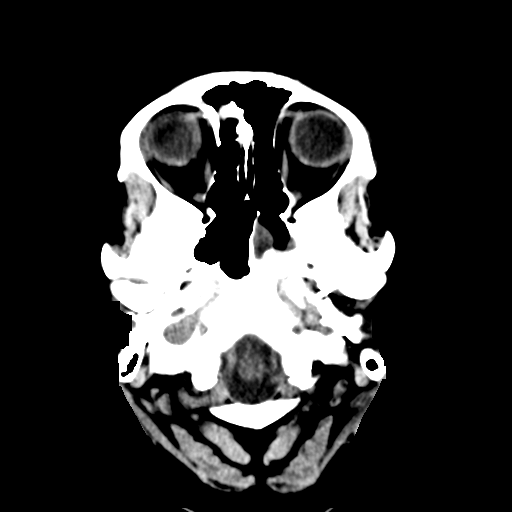
[im 3/31  bone]
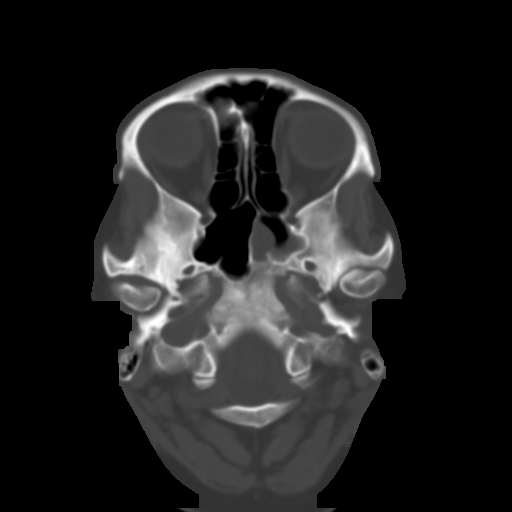
[im 6/31  brain]
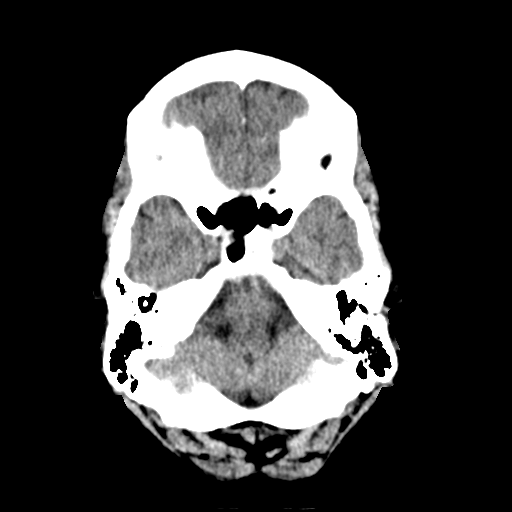
[im 9/31  brain]
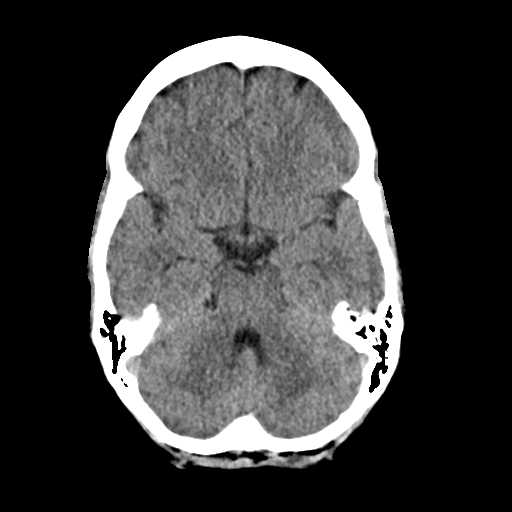
[im 12/31  brain]
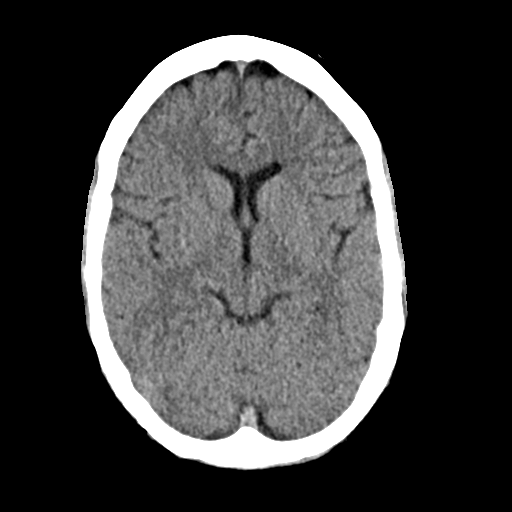
[im 16/31  brain]
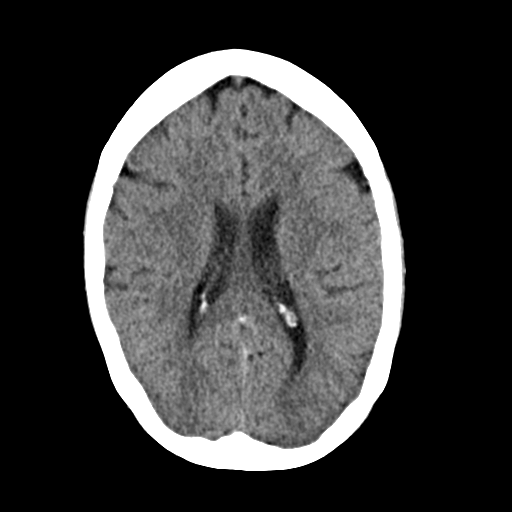
[im 16/31  bone]
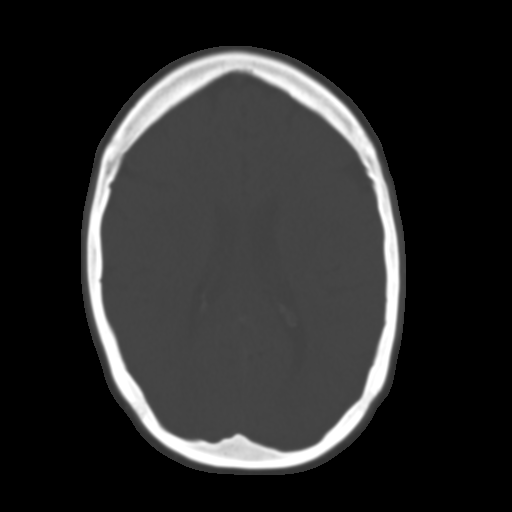
[im 19/31  brain]
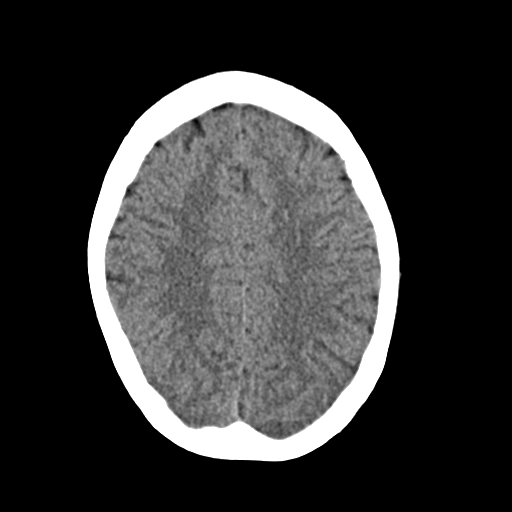
[im 22/31  brain]
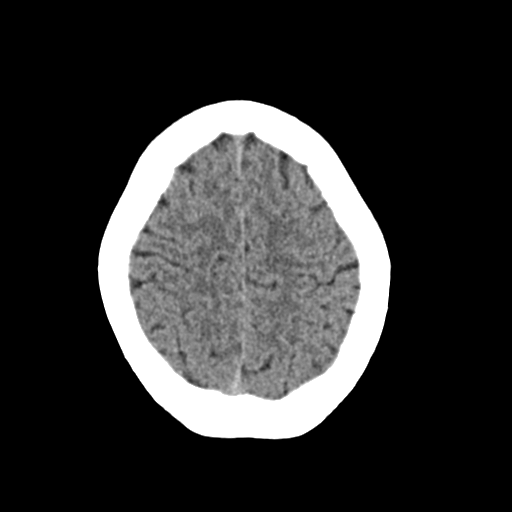
[im 25/31  brain]
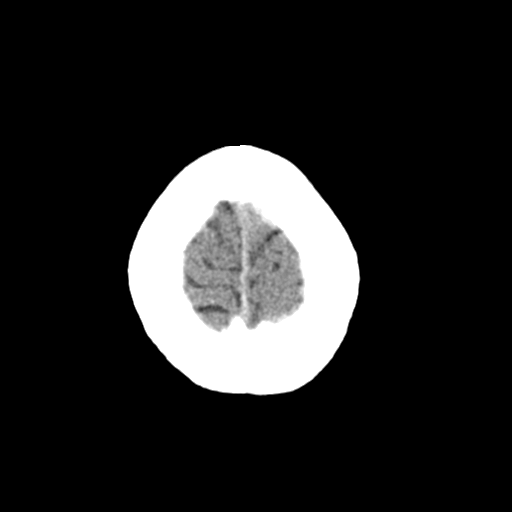
[im 28/31  brain]
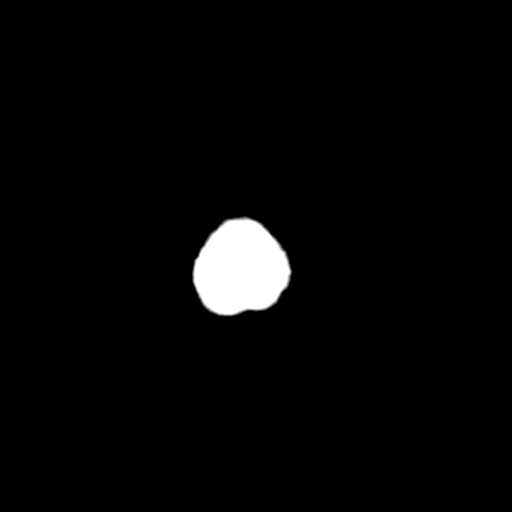
[im 28/31  bone]
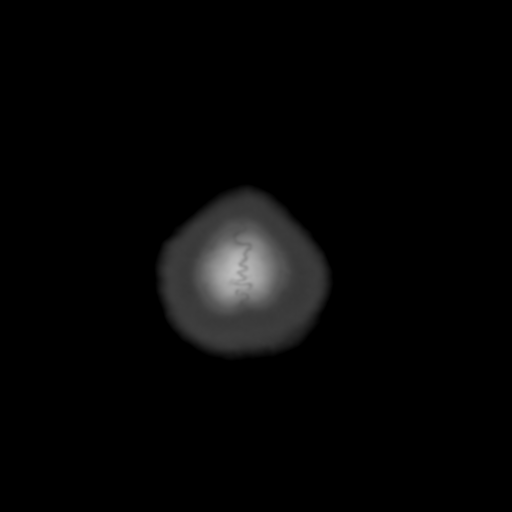

[Series 4: coronal soft · coronal · 0.31mm/px · 3 of 68 slices shown]
[im 23/68  brain]
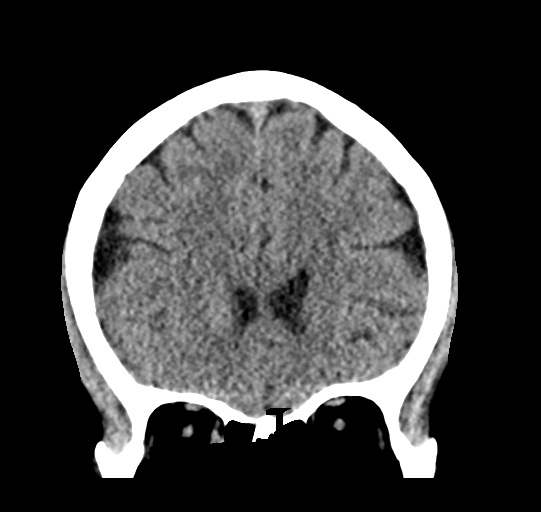
[im 30/68  brain]
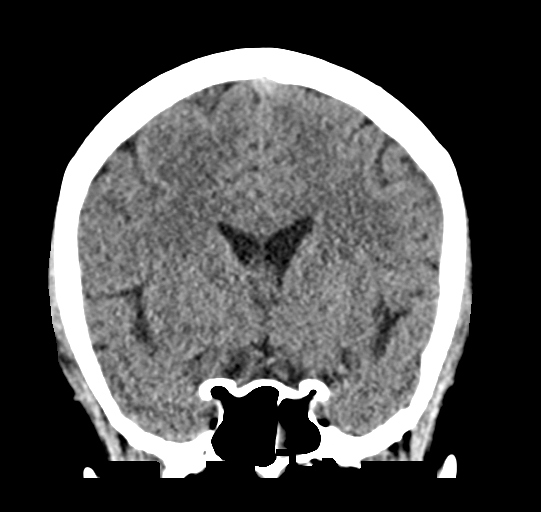
[im 38/68  brain]
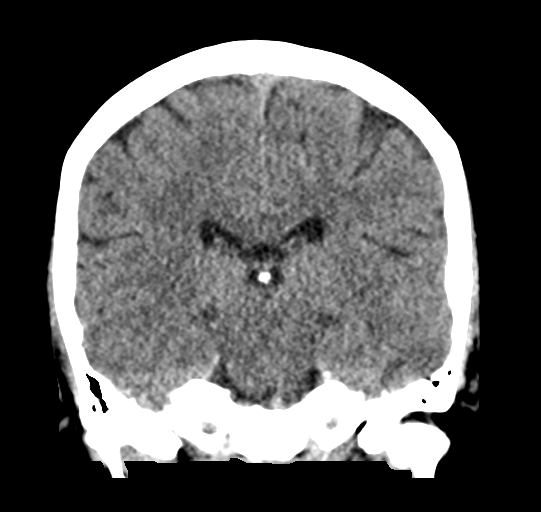

[Series 5: sag soft · sagittal · 0.32mm/px · 3 of 52 slices shown]
[im 18/52  brain]
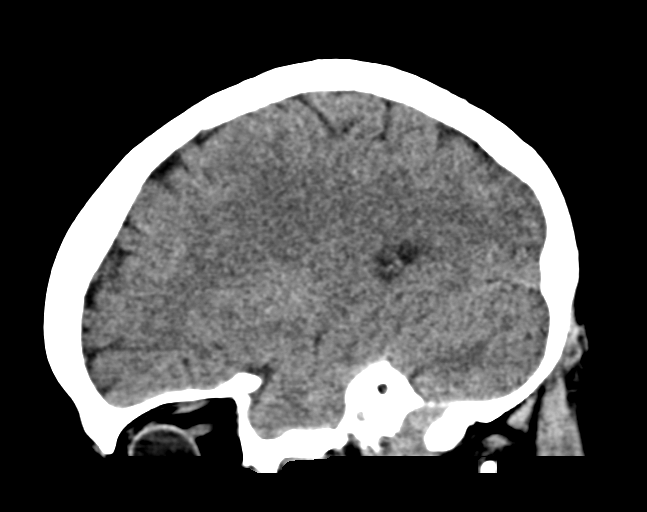
[im 26/52  brain]
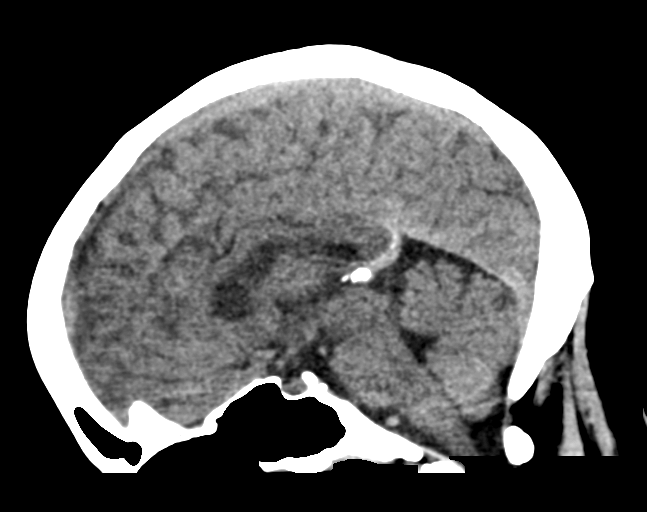
[im 35/52  brain]
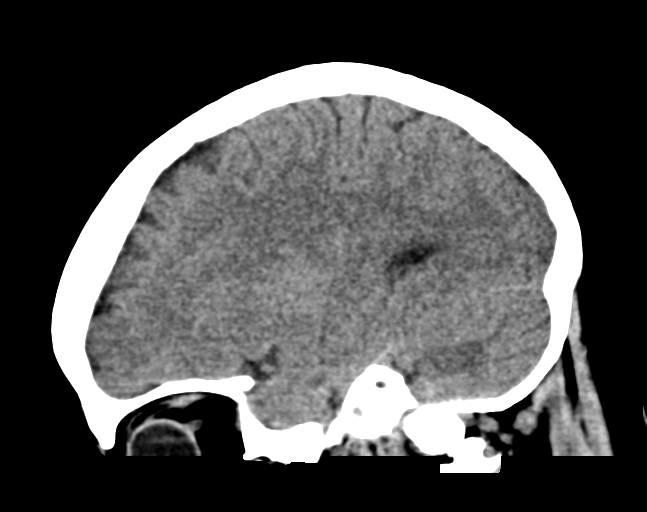

[15 of 47 positions shown; findings below may reference images not displayed]

FINDINGS: Brain:

No evidence of acute intracranial hemorrhage.

No demarcated cortical infarction.

No evidence of intracranial mass.

No midline shift or extra-axial fluid collection.

There is minimal scattered nonspecific white matter hypodensity.

Cerebral volume is normal for age.

Vascular: No hyperdense vessel.

Skull: Normal. Negative for fracture or focal lesion.

Sinuses/Orbits: Visualized orbits demonstrate no acute abnormality.
Moderate-sized left sphenoid sinus mucous retention cyst.
IMPRESSION: No CT evidence of acute intracranial abnormality. Given provided
history, consider brain MRI for further evaluation.

Minimal nonspecific scattered white matter disease.

Left sphenoid sinus mucous retention cysts

## 2019-03-28 MED ORDER — IOHEXOL 350 MG/ML SOLN
100.0000 mL | Freq: Once | INTRAVENOUS | Status: AC | PRN
  Administered 2019-03-28: 100 mL via INTRAVENOUS

## 2019-03-28 MED ORDER — OXYCODONE-ACETAMINOPHEN 5-325 MG PO TABS
2.0000 | ORAL_TABLET | Freq: Once | ORAL | Status: AC
  Administered 2019-03-28: 2 via ORAL
  Filled 2019-03-28: qty 2

## 2019-03-28 MED ORDER — TETRACAINE HCL 0.5 % OP SOLN
2.0000 [drp] | Freq: Once | OPHTHALMIC | Status: AC
  Administered 2019-03-28: 2 [drp] via OPHTHALMIC
  Filled 2019-03-28: qty 4

## 2019-03-28 MED ORDER — ASPIRIN EC 81 MG PO TBEC
81.0000 mg | DELAYED_RELEASE_TABLET | Freq: Once | ORAL | Status: AC
  Administered 2019-03-28: 81 mg via ORAL
  Filled 2019-03-28: qty 1

## 2019-03-28 MED ORDER — ACETAMINOPHEN 160 MG/5ML PO SOLN: 650.0000 mg | ORAL | Status: DC | PRN

## 2019-03-28 MED ORDER — FLUORESCEIN SODIUM 1 MG OP STRP
1.0000 | ORAL_STRIP | Freq: Once | OPHTHALMIC | Status: AC
  Administered 2019-03-28: 1 via OPHTHALMIC
  Filled 2019-03-28: qty 1

## 2019-03-28 MED ORDER — SENNOSIDES-DOCUSATE SODIUM 8.6-50 MG PO TABS: 1.0000 | ORAL_TABLET | Freq: Every evening | ORAL | Status: DC | PRN

## 2019-03-28 MED ORDER — ACETAMINOPHEN 325 MG PO TABS: 650.0000 mg | ORAL_TABLET | ORAL | Status: DC | PRN

## 2019-03-28 MED ORDER — ENOXAPARIN SODIUM 40 MG/0.4ML ~~LOC~~ SOLN
40.0000 mg | SUBCUTANEOUS | Status: DC
  Administered 2019-03-29: 40 mg via SUBCUTANEOUS
  Filled 2019-03-28: qty 0.4

## 2019-03-28 MED ORDER — OXYCODONE-ACETAMINOPHEN 5-325 MG PO TABS
2.0000 | ORAL_TABLET | ORAL | Status: DC | PRN
  Administered 2019-03-28 – 2019-03-29 (×4): 2 via ORAL
  Filled 2019-03-28 (×4): qty 2

## 2019-03-28 MED ORDER — ATORVASTATIN CALCIUM 40 MG PO TABS: 40.0000 mg | ORAL_TABLET | Freq: Every day | ORAL | Status: DC

## 2019-03-28 MED ORDER — ASPIRIN 325 MG PO TABS
325.0000 mg | ORAL_TABLET | Freq: Every day | ORAL | Status: DC
  Administered 2019-03-29: 325 mg via ORAL
  Filled 2019-03-28: qty 1

## 2019-03-28 MED ORDER — GADOBUTROL 1 MMOL/ML IV SOLN
8.0000 mL | Freq: Once | INTRAVENOUS | Status: AC | PRN
  Administered 2019-03-28: 8 mL via INTRAVENOUS

## 2019-03-28 MED ORDER — STROKE: EARLY STAGES OF RECOVERY BOOK
Freq: Once | Status: AC
  Filled 2019-03-28 (×2): qty 1

## 2019-03-28 MED ORDER — SODIUM CHLORIDE 0.9 % IV SOLN: INTRAVENOUS | Status: DC

## 2019-03-28 MED ORDER — ACETAMINOPHEN 650 MG RE SUPP: 650.0000 mg | RECTAL | Status: DC | PRN

## 2019-03-28 NOTE — Consult Note (Signed)
[facility] Consult Services  Stat Consult  Date of Service:   03/28/2019 13:28:27  Impression:     .  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  H53.2 - Diplopia  Comments/Sign-Out: Patient has intermittent painful visual loss and double vision. She has numbness and tingling on one side and she has had loss of speech. On exam today all she had was some smile sensory loss on the left side. CT was negative. There is no evidence of an acute stroke. It is possible she is having small vessel disease. He demyelinating process is a possibility as well although symptoms are very transient. An inflammatory disorder is a possibility. She needs to be transferred to a facility with MRI. She needs MRI the brain without and with contrast, MRA of the head and neck, echocardiogram. She's EKG monitoring. I discussed this with the physician assistant who was going to make arrangements. I would start her on low-dose aspirin at this time.  CT HEAD: Showed No Acute Hemorrhage or Acute Core Infarct Reviewed No acute changes  Metrics: [facility] Notification Time: 03/28/2019 13:27:32 Stamp Time: 03/28/2019 13:28:27 Callback Response Time: 03/28/2019 13:34:39  Our recommendations are outlined below.  Recommendations:     .  Initiate Aspirin 81 MG Daily   Imaging Studies:     .  MRI Head with and Without Contrast     .  MRA Head and Neck Without Contrast When Available - Stroke Protocol     .  Echocardiogram - Transthoracic Echocardiogram  Other WorkUp:     .  Check CMP     .  Check B12 level     .  Check TSH  Disposition: Neurology Follow Up Recommended  Sign Out:     .  Discussed with Emergency Department Provider  ----------------------------------------------------------------------------------------------------  Chief Complaint: Double vision in vision loss.  History of Present Illness: Patient is a 44 year old Female.  This is a  44 year old woman who beginning five days ago has had several episodes of complete blindness of the left eye most of which lasted two or three minutes and were painful but one of them lasted longer. She also has had double vision irrespective of direction of gaze. Friday she had slurred speech. She has had numbness and tingling of her left side. She may have hyperthyroidism. She is an active smoker.    Past Medical History:     . Hypertension     . Hyperlipidemia     . There is NO history of Diabetes Mellitus     . There is NO history of Atrial Fibrillation     . There is NO history of Coronary Artery Disease  Anticoagulant use:  No  Antiplatelet use: No     Examination: BP(118/78), Pulse(91), Blood Glucose(109) 1A: Level of Consciousness - Alert; keenly responsive + 0 1B: Ask Month and Age - Both Questions Right + 0 1C: Blink Eyes & Squeeze Hands - Performs Both Tasks + 0 2: Test Horizontal Extraocular Movements - Normal + 0 3: Test Visual Fields - No Visual Loss + 0 4: Test Facial Palsy (Use Grimace if Obtunded) - Normal symmetry + 0 5A: Test Left Arm Motor Drift - No Drift for 10 Seconds + 0 5B: Test Right Arm Motor Drift - No Drift for 10 Seconds + 0 6A: Test Left Leg Motor Drift - No Drift for 5 Seconds + 0 6B: Test Right Leg Motor Drift - No Drift for  5 Seconds + 0 7: Test Limb Ataxia (FNF/Heel-Shin) - No Ataxia + 0 8: Test Sensation - Mild-Moderate Loss: Less Sharp/More Dull + 1 9: Test Language/Aphasia - Normal; No aphasia + 0 10: Test Dysarthria - Normal + 0 11: Test Extinction/Inattention - No abnormality + 0  NIHSS Score: 1   Due to the immediate potential for life-threatening deterioration due to underlying acute neurologic illness, I spent 22 minutes providing critical care. This time includes time for face to face visit via telemedicine, review of medical records, imaging studies and discussion of findings with providers, the patient and/or family.   Dr Cindie Laroche   [facility] [phone]  Case [id number]

## 2019-03-28 NOTE — ED Provider Notes (Signed)
[facility] EMERGENCY DEPARTMENT Provider Note   CSN: [id number] Arrival date & time: 03/28/19  1017     History Chief Complaint  Patient presents with  . Numbness    Breanna Wise is a 44 y.o. female with PMHx chronic back pain and depression who presents to the ED today with complaints of left sided tingling and weakness x 5 days. Pt reports that on Friday 03/24/2019 she was driving when she had sudden loss of vision in her left eye and double vision in her right eye.  Patient states that she began having a tingling sensation and weakness to her left side.  She states that she drove home 30 minutes and when she got there her boyfriend had noticed that she had facial drooping and slurring of speech.  She states that her boyfriend noticed slurring of speech continuously for the next 2 days.  She states that she has gained some vision back in her left eye but has continued to have double vision in her right eye.  She states that she went to her PCPs office today and was transferred via EMS to [facility].  She was evaluated in triage and there was concern for strokelike symptoms but unfortunately patient left prior to being seen as there was a 10-hour wait.  She did not have any imaging at that time.  She states that she returns today as her family is concerned.  No history of strokes in the past.   The history is provided by the patient.       Past Medical History:  Diagnosis Date  . Back pain   . Depression   . Scoliosis     There are no problems to display for this patient.   Past Surgical History:  Procedure Laterality Date  . BACK SURGERY    . CHOLECYSTECTOMY       OB History   No obstetric history on file.     No family history on file.  Social History   Tobacco Use  . Smoking status: Current Every Day Smoker  . Smokeless tobacco: Never Used  Substance Use Topics  . Alcohol use: Yes  . Drug use: No    Home Medications Prior to Admission  medications   Medication Sig Start Date End Date Taking? Authorizing Provider  clonazePAM (KLONOPIN) 0.5 MG tablet Take 0.5 mg by mouth 2 (two) times daily as needed for anxiety.    [provider]  escitalopram (LEXAPRO) 20 MG tablet Take 20 mg by mouth daily.    [provider]  gabapentin (NEURONTIN) 100 MG capsule Take 100 mg by mouth at bedtime.    [provider]  methocarbamol (ROBAXIN) 500 MG tablet Take 1 tablet (500 mg total) by mouth 2 (two) times daily. 02/14/17   Hedges, Dellis Filbert, PA-C  oxyCODONE-acetaminophen (PERCOCET/ROXICET) 5-325 MG tablet Take by mouth every 4 (four) hours as needed for severe pain.    [provider]    Allergies    Penicillins and Rocephin [ceftriaxone sodium in dextrose]  Review of Systems   Review of Systems  Constitutional: Negative for chills and fever.  Eyes: Positive for visual disturbance.  Respiratory: Negative for shortness of breath.   Cardiovascular: Negative for chest pain.  Gastrointestinal: Negative for abdominal pain, nausea and vomiting.  Neurological: Positive for speech difficulty, weakness and numbness. Negative for syncope.  All other systems reviewed and are negative.   Physical Exam Updated Vital Signs BP 118/78 (BP Location: Right  Arm)   Pulse 86   Temp 98.8 F (37.1 C) (Oral)   Resp 19   Ht 5\' 3"  (1.6 m)   Wt 81.6 kg   LMP 03/09/2019   SpO2 100%   BMI 31.89 kg/m   Physical Exam Vitals and nursing note reviewed.  Constitutional:      Appearance: She is not ill-appearing or diaphoretic.  HENT:     Head: Normocephalic and atraumatic.     Right Ear: Tympanic membrane normal.     Left Ear: Tympanic membrane normal.  Eyes:     Intraocular pressure: Left eye pressure is 8 mmHg.     Extraocular Movements: Extraocular movements intact.     Right eye: Normal extraocular motion and no nystagmus.     Left eye: Normal extraocular motion and no nystagmus.     Conjunctiva/sclera:  Conjunctivae normal.     Right eye: Right conjunctiva is not injected.     Left eye: Left conjunctiva is not injected.     Pupils: Pupils are equal, round, and reactive to light.     Right eye: No fluorescein uptake.     Left eye: No fluorescein uptake.     Slit lamp exam:    Left eye: Anterior chamber quiet. No anterior chamber flares.     Comments: Visual Acuity Bilateral Distance:20/63 R Distance:20/63 L Distance:20/80  Cardiovascular:     Rate and Rhythm: Normal rate and regular rhythm.     Pulses: Normal pulses.  Pulmonary:     Effort: Pulmonary effort is normal.     Breath sounds: Normal breath sounds. No wheezing, rhonchi or rales.  Abdominal:     Palpations: Abdomen is soft.     Tenderness: There is no abdominal tenderness. There is no guarding or rebound.  Musculoskeletal:     Cervical back: Neck supple.  Skin:    General: Skin is warm and dry.  Neurological:     Mental Status: She is alert and oriented to person, place, and time.     GCS: GCS eye subscore is 4. GCS verbal subscore is 5. GCS motor subscore is 6.     Motor: Pronator drift present.     Coordination: [facility] abnormal.     Comments: Diminished sensation to left side of face along all nerve distributions  Remainder of CN intact. No obvious facial droop on my exam.  A&O x4 GCS 15 Strength 4/5 to LUE and LLE. 5/5 to RUE and RLE. Sensation diminished on left side.  Coordination with finger-to-nose WNL. Some difficulty with heel to shin although able to do it after some time.  + pronator drift     ED Results / Procedures / Treatments   Labs (all labs ordered are listed, but only abnormal results are displayed) Labs Reviewed  BASIC METABOLIC PANEL - Abnormal; Notable for the following components:      Result Value   Glucose, Bld 109 (*)    All other components within normal limits  SARS CORONAVIRUS 2 (TAT 6-24 HRS)  CBC  URINALYSIS, ROUTINE W REFLEX MICROSCOPIC  PREGNANCY, URINE     EKG None  Radiology CT Angio Head W or Wo Contrast  Result Date: 03/28/2019 CLINICAL DATA:  44 year old female with neurologic deficits including loss of vision, double vision, left extremity numbness and tingling. EXAM: CT ANGIOGRAPHY HEAD AND NECK TECHNIQUE: Multidetector CT imaging of the head and neck was performed using the standard protocol during bolus administration of intravenous contrast. Multiplanar CT image reconstructions  and MIPs were obtained to evaluate the vascular anatomy. Carotid stenosis measurements (when applicable) are obtained utilizing NASCET criteria, using the distal internal carotid diameter as the denominator. CONTRAST:  OMNIPAQUE IOHEXOL 350 MG/ML SOLN COMPARISON:  Plain head CT 1100 hours today. FINDINGS: CT HEAD Brain: Gray-white matter differentiation remains within normal limits. Stable non contrast CT appearance of the brain. No acute intracranial abnormality. Calvarium and skull base: Negative. Paranasal sinuses: Stable lobulated mucosal thickening in the left sphenoid sinus. Other Visualized paranasal sinuses and mastoids are stable and well pneumatized. Orbits: Visualized orbits and scalp soft tissues are within normal limits. CTA NECK Skeleton: Absent maxillary dentition. No acute osseous abnormality identified. Upper chest: Dependent ground-glass opacity in both lungs appear symmetric and might be atelectasis. No superimposed mediastinal or hilar lymphadenopathy, pleural or pericardial effusion. Visible central pulmonary arteries appear patent. Other neck: Negative. Aortic arch: 3 vessel arch configuration with no arch atherosclerosis. Right carotid system: Negative. Mildly tortuous cervical right ICA. Left carotid system: Negative. Vertebral arteries: Normal proximal right subclavian artery and right vertebral artery origins. The right vertebral artery appears dominant and is patent to the skull base without plaque or stenosis. Normal proximal left subclavian  artery and left vertebral artery origin. The left vertebral artery is mildly non dominant and patent to the skull base without plaque or stenosis. CTA HEAD Posterior circulation: No distal vertebral artery plaque or stenosis. Normal PICA origins, vertebrobasilar junction. The right V4 segment is dominant. Patent basilar artery without stenosis. Normal SCA and PCA origins. Posterior communicating arteries are diminutive or absent. Bilateral PCA branches are within normal limits. Anterior circulation: Both ICA siphons are patent. On the left there is no siphon plaque or stenosis. Likewise the right siphon appears patent and normal. The right ophthalmic artery origin appears normal on series 13, image 87. Patent carotid termini. Normal MCA and ACA origins. Anterior communicating artery and bilateral ACA branches are within normal limits. Left MCA M1 bifurcates early without stenosis. Right MCA M1 segment and bifurcation are patent without stenosis. Bilateral MCA branches are within normal limits. Venous sinuses: Patent. Anatomic variants: Dominant right vertebral artery. Review of the MIP images confirms the above findings IMPRESSION: 1. No large vessel occlusion and normal arterial findings on CTA Head and Neck. 2. Stable and negative CT appearance of the brain. 3. Bilateral pulmonary ground-glass opacity in the visible lungs is favored to be atelectasis rather than alveolar edema or infection. And otherwise negative visible upper chest. Electronically Signed   By: Odessa Fleming M.D.   On: 03/28/2019 16:27   CT Head Wo Contrast  Result Date: 03/28/2019 CLINICAL DATA:  Focal neuro deficit, greater than 6 hours, stroke suspected. Additional history provided: Loss vision on Friday, headache behind left eye, double vision right eye, left arm and left leg numbness. EXAM: CT HEAD WITHOUT CONTRAST TECHNIQUE: Contiguous axial images were obtained from the base of the skull through the vertex without intravenous contrast.  COMPARISON:  No pertinent prior studies available for comparison. FINDINGS: Brain: No evidence of acute intracranial hemorrhage. No demarcated cortical infarction. No evidence of intracranial mass. No midline shift or extra-axial fluid collection. There is minimal scattered nonspecific white matter hypodensity. Cerebral volume is normal for age. Vascular: No hyperdense vessel. Skull: Normal. Negative for fracture or focal lesion. Sinuses/Orbits: Visualized orbits demonstrate no acute abnormality. Moderate-sized left sphenoid sinus mucous retention cyst. IMPRESSION: No CT evidence of acute intracranial abnormality. Given provided history, consider brain MRI for further evaluation. Minimal nonspecific scattered white matter  disease. Left sphenoid sinus mucous retention cysts Electronically Signed   By: Jackey Loge DO   On: 03/28/2019 11:10   CT Angio Neck W and/or Wo Contrast  Result Date: 03/28/2019 CLINICAL DATA:  44 year old female with neurologic deficits including loss of vision, double vision, left extremity numbness and tingling. EXAM: CT ANGIOGRAPHY HEAD AND NECK TECHNIQUE: Multidetector CT imaging of the head and neck was performed using the standard protocol during bolus administration of intravenous contrast. Multiplanar CT image reconstructions and MIPs were obtained to evaluate the vascular anatomy. Carotid stenosis measurements (when applicable) are obtained utilizing NASCET criteria, using the distal internal carotid diameter as the denominator. CONTRAST:  OMNIPAQUE IOHEXOL 350 MG/ML SOLN COMPARISON:  Plain head CT 1100 hours today. FINDINGS: CT HEAD Brain: Gray-white matter differentiation remains within normal limits. Stable non contrast CT appearance of the brain. No acute intracranial abnormality. Calvarium and skull base: Negative. Paranasal sinuses: Stable lobulated mucosal thickening in the left sphenoid sinus. Other Visualized paranasal sinuses and mastoids are stable and well  pneumatized. Orbits: Visualized orbits and scalp soft tissues are within normal limits. CTA NECK Skeleton: Absent maxillary dentition. No acute osseous abnormality identified. Upper chest: Dependent ground-glass opacity in both lungs appear symmetric and might be atelectasis. No superimposed mediastinal or hilar lymphadenopathy, pleural or pericardial effusion. Visible central pulmonary arteries appear patent. Other neck: Negative. Aortic arch: 3 vessel arch configuration with no arch atherosclerosis. Right carotid system: Negative. Mildly tortuous cervical right ICA. Left carotid system: Negative. Vertebral arteries: Normal proximal right subclavian artery and right vertebral artery origins. The right vertebral artery appears dominant and is patent to the skull base without plaque or stenosis. Normal proximal left subclavian artery and left vertebral artery origin. The left vertebral artery is mildly non dominant and patent to the skull base without plaque or stenosis. CTA HEAD Posterior circulation: No distal vertebral artery plaque or stenosis. Normal PICA origins, vertebrobasilar junction. The right V4 segment is dominant. Patent basilar artery without stenosis. Normal SCA and PCA origins. Posterior communicating arteries are diminutive or absent. Bilateral PCA branches are within normal limits. Anterior circulation: Both ICA siphons are patent. On the left there is no siphon plaque or stenosis. Likewise the right siphon appears patent and normal. The right ophthalmic artery origin appears normal on series 13, image 87. Patent carotid termini. Normal MCA and ACA origins. Anterior communicating artery and bilateral ACA branches are within normal limits. Left MCA M1 bifurcates early without stenosis. Right MCA M1 segment and bifurcation are patent without stenosis. Bilateral MCA branches are within normal limits. Venous sinuses: Patent. Anatomic variants: Dominant right vertebral artery. Review of the MIP images  confirms the above findings IMPRESSION: 1. No large vessel occlusion and normal arterial findings on CTA Head and Neck. 2. Stable and negative CT appearance of the brain. 3. Bilateral pulmonary ground-glass opacity in the visible lungs is favored to be atelectasis rather than alveolar edema or infection. And otherwise negative visible upper chest. Electronically Signed   By: Odessa Fleming M.D.   On: 03/28/2019 16:27    Procedures Procedures (including critical care time)  Medications Ordered in ED Medications  oxyCODONE-acetaminophen (PERCOCET/ROXICET) 5-325 MG per tablet 2 tablet (2 tablets Oral Given 03/28/19 1442)  fluorescein ophthalmic strip 1 strip (1 strip Both Eyes Given 03/28/19 1444)  tetracaine (PONTOCAINE) 0.5 % ophthalmic solution 2 drop (2 drops Both Eyes Given 03/28/19 1443)  aspirin EC tablet 81 mg (81 mg Oral Given 03/28/19 1446)  iohexol (OMNIPAQUE) 350 MG/ML injection  100 mL (100 mLs Intravenous Contrast Given 03/28/19 1514)    ED Course  I have reviewed the triage vital signs and the nursing notes.  Pertinent labs & imaging results that were available during my care of the patient were reviewed by me and considered in my medical decision making (see chart for details).  44 year old female presenting to the ED with stroke like symptoms that occurred 5 days ago 03/24/2019. Pt has continued to have partial loss of vision in L eye, double vision in R eye, tingling/weakness on L side with family history of facial droop and speech difficulties although none seen on my exam today. Pt was seen at [facility] yesterday but LWBS due to increasing wait times; no labs or imaging done at that time. Pt has decreased strength on left side with positive pronator drift on my exam today. Visual acuity as above. CT head done at this facility with no acute findings; radiologist had recommended MRI given hx obtained; unfortunately we do not have MRI capability in this ED. All other labwork unremarkable.  Will have teleneuro evaluate patient but regardless she will need to be admitted for stroke work up.   Pt evaluated by Dr. Ezzard Standing with teleneuro - recommends admission for further workup. Please see his note. I have performed eye exam; no obvious findings for pt's eye pain and vision loss today. Question if pt is experiencing optic neuritic with MS vs stroke? There is concern that pt could have permanent vision loss of eye; recommends MRI brain and MRA head/neck. Given there is need for relatively emergent MRI and question how long pt would sit in this ED until transfer is available will do ED to ED transfer with plan for admission. Dr. Rhunette Croft at [facility] is accepting. He recommends CTA head and neck here prior to transfer; will order this as well as MRI.   CTAs negative for acute process. Pt transferred by [facility] to [facility] ED where she is to have MRIs done and then admission for further workup.   Clinical Course as of Mar 28 1631  Tue Mar 28, 2019  1431 Dr. Ezzard Standing - who recommends MRI/MRA Brain for further evaluation. Pt had relayed to him that she is also having eye pain; will proceed with eye exam prior to admission for stroke work up   [MV]  1511 Discussed case with Dr. Rhunette Croft for ED to ED transfer so that pt can receive her MRI/MRA in a timely manner; recommends CTA head and neck prior to transfer.    [MV]    Clinical Course User Index [MV] Tanda Rockers, PA-C   MDM Rules/Calculators/A&P                       Final Clinical Impression(s) / ED Diagnoses Final diagnoses:  Left sided numbness  Vision loss of left eye    Rx / DC Orders ED Discharge Orders    None       Tanda Rockers, PA-C 03/28/19 1634    Gwyneth Sprout, MD 04/05/19 1115

## 2019-03-28 NOTE — ED Triage Notes (Signed)
Pt c/o L arm tingling and numbness, L leg pain. States she has had intermittent double vision in both eyes. This started Friday. She saw her PCP yesterday and was sent to ED but left before being seen due to wait time.

## 2019-03-28 NOTE — ED Notes (Signed)
Patient transported to CT 

## 2019-03-28 NOTE — H&P (Signed)
History and Physical   Breanna Wise PJA:[id number] DOB: 29-Mar-1975 DOA: 03/28/2019  Referring MD/NP/PA: Dr. Roslynn Amble  PCP: Antonietta Jewel, MD   Outpatient Specialists: None  Patient coming from: Home  Chief Complaint: Numbness  HPI: Breanna Wise is a 44 y.o. female with medical history significant of depression, scoliosis with chronic back pain, no prior cardiac disease who presents to the ER with left-sided tingling and weakness for about 5 days.  Patient symptoms started when she was driving she had sudden loss of vision in the left side with double vision of the right eye.  She began having tingling sensation and weakness of the left side.  She went home there where her boyfriend noticed facial drooping and slurred speech.  Patient did not come to the ER at the time.  But symptoms continued to get worse so she decided to go to [facility].  She was seen and evaluated and sent over here for work-up.  Patient was seen by neurology and outside window for code stroke but patient being admitted with TIA work-up.  Symptoms have now resolved..  ED Course: Temperature 98.8 blood pressure 101/75 pulse 95 respirate 22 oxygen sat 97% room air.  CBC and chemistry all within normal.  CT angiogram of the head and CT angiogram of the neck were all within normal.  MRI of the brain with angiogram also within normal.  Patient is now being admitted for observation and TIA work-up.  Review of Systems: As per HPI otherwise 10 point review of systems negative.    Past Medical History:  Diagnosis Date  . Back pain   . Depression   . Scoliosis     Past Surgical History:  Procedure Laterality Date  . BACK SURGERY    . CHOLECYSTECTOMY       reports that she has been smoking. She has never used smokeless tobacco. She reports current alcohol use. She reports that she does not use drugs.  Allergies  Allergen Reactions  . Penicillins Swelling  . Rocephin [Ceftriaxone Sodium In Dextrose]  Swelling    No family history on file.   Prior to Admission medications   Medication Sig Start Date End Date Taking? Authorizing Provider  clonazePAM (KLONOPIN) 0.5 MG tablet Take 0.5 mg by mouth 2 (two) times daily as needed for anxiety.    [provider]  escitalopram (LEXAPRO) 20 MG tablet Take 20 mg by mouth daily.    [provider]  gabapentin (NEURONTIN) 100 MG capsule Take 100 mg by mouth at bedtime.    [provider]  methocarbamol (ROBAXIN) 500 MG tablet Take 1 tablet (500 mg total) by mouth 2 (two) times daily. 02/14/17   Hedges, Dellis Filbert, PA-C  oxyCODONE-acetaminophen (PERCOCET/ROXICET) 5-325 MG tablet Take by mouth every 4 (four) hours as needed for severe pain.    [provider]    Physical Exam: Vitals:   03/28/19 1346 03/28/19 1547 03/28/19 1624 03/28/19 1801  BP: 118/78  111/73 112/71  Pulse: 86 82 81 72  Resp: 19 (!) _0 Temp:    98.7 F (37.1 C)  TempSrc:    Oral  SpO2: 100% 98% 99% 97%  Weight:      Height:          Constitutional: NAD, calm, comfortable Vitals:   03/28/19 1346 03/28/19 1547 03/28/19 1624 03/28/19 1801  BP: 118/78  111/73 112/71  Pulse: 86 82 81 72  Resp: 19 (!) _1 Temp:  98.7 F (37.1 C)  TempSrc:    Oral  SpO2: 100% 98% 99% 97%  Weight:      Height:       Eyes: PERRL, lids and conjunctivae normal ENMT: Mucous membranes are moist. Posterior pharynx clear of any exudate or lesions.Normal dentition.  Neck: normal, supple, no masses, no thyromegaly Respiratory: clear to auscultation bilaterally, no wheezing, no crackles. Normal respiratory effort. No accessory muscle use.  Cardiovascular: Regular rate and rhythm, no murmurs / rubs / gallops. No extremity edema. 2+ pedal pulses. No carotid bruits.  Abdomen: no tenderness, no masses palpated. No hepatosplenomegaly. Bowel sounds positive.  Musculoskeletal: no clubbing / cyanosis. No joint deformity upper and lower extremities. Good  ROM, no contractures. Normal muscle tone.  Skin: no rashes, lesions, ulcers. No induration Neurologic: CN 2-12 grossly intact. Sensation intact, DTR normal. Strength 5/5 in all 4.  Psychiatric: Normal judgment and insight. Alert and oriented x 3. Normal mood.     Labs on Admission: I have personally reviewed following labs and imaging studies  CBC: Recent Labs  Lab 03/28/19 1038  WBC 6.5  HGB 14.1  HCT 43.8  MCV 89.8  PLT 094   Basic Metabolic Panel: Recent Labs  Lab 03/28/19 1038  NA 137  K 3.8  CL 105  CO2 23  GLUCOSE 109*  BUN 9  CREATININE 0.68  CALCIUM 8.9   GFR: Estimated Creatinine Clearance: 91.8 mL/min (by C-G formula based on SCr of 0.68 mg/dL). Liver Function Tests: No results for input(s): AST, ALT, ALKPHOS, BILITOT, PROT, ALBUMIN in the last 168 hours. No results for input(s): LIPASE, AMYLASE in the last 168 hours. No results for input(s): AMMONIA in the last 168 hours. Coagulation Profile: No results for input(s): INR, PROTIME in the last 168 hours. Cardiac Enzymes: No results for input(s): CKTOTAL, CKMB, CKMBINDEX, TROPONINI in the last 168 hours. BNP (last 3 results) No results for input(s): PROBNP in the last 8760 hours. HbA1C: No results for input(s): HGBA1C in the last 72 hours. CBG: No results for input(s): GLUCAP in the last 168 hours. Lipid Profile: No results for input(s): CHOL, HDL, LDLCALC, TRIG, CHOLHDL, LDLDIRECT in the last 72 hours. Thyroid Function Tests: No results for input(s): TSH, T4TOTAL, FREET4, T3FREE, THYROIDAB in the last 72 hours. Anemia Panel: No results for input(s): VITAMINB12, FOLATE, FERRITIN, TIBC, IRON, RETICCTPCT in the last 72 hours. Urine analysis:    Component Value Date/Time   COLORURINE YELLOW 03/28/2019 1038   APPEARANCEUR CLEAR 03/28/2019 1038   LABSPEC 1.020 03/28/2019 1038   PHURINE 6.5 03/28/2019 1038   GLUCOSEU NEGATIVE 03/28/2019 1038   HGBUR NEGATIVE 03/28/2019 [place]  03/28/2019 1038   KETONESUR NEGATIVE 03/28/2019 1038   PROTEINUR NEGATIVE 03/28/2019 1038   UROBILINOGEN 0.2 03/07/2011 1520   NITRITE NEGATIVE 03/28/2019 [place] 03/28/2019 1038   Sepsis Labs: _0 (procalcitonin:4,lacticidven:4) ) Recent Results (from the past 240 hour(s))  SARS Coronavirus 2 Ag (30 min TAT) - Nasal Swab (BD Veritor Kit)     Status: None   Collection Time: 03/28/19  4:40 PM   Specimen: Nasal Swab (BD Veritor Kit)  Result Value Ref Range Status   SARS Coronavirus 2 Ag NEGATIVE NEGATIVE Final    Comment: (NOTE) SARS-CoV-2 antigen NOT DETECTED.  Negative results are presumptive.  Negative results do not preclude SARS-CoV-2 infection and should not be used as the sole basis for treatment or other patient management decisions, including infection  control decisions, particularly in the presence  of clinical signs and  symptoms consistent with COVID-19, or in those who have been in contact with the virus.  Negative results must be combined with clinical observations, patient history, and epidemiological information. The expected result is Negative. Fact Sheet for Patients: PodPark.tn Fact Sheet for Healthcare Providers: GiftContent.is This test is not yet approved or cleared by the [place] FDA and  has been authorized for detection and/or diagnosis of SARS-CoV-2 by FDA under an Emergency Use Authorization (EUA).  This EUA will remain in effect (meaning this test can be used) for the duration of  the COVID-19 de claration under Section 564(b)(1) of the Act, 21 U.S.C. section 360bbb-3(b)(1), unless the authorization is terminated or revoked sooner. Performed at [facility], [address] [place], [place] [address]      Radiological Exams on Admission: CT Angio Head W or Wo Contrast  Result Date: 03/28/2019 CLINICAL DATA:  44 year old female with neurologic  deficits including loss of vision, double vision, left extremity numbness and tingling. EXAM: CT ANGIOGRAPHY HEAD AND NECK TECHNIQUE: Multidetector CT imaging of the head and neck was performed using the standard protocol during bolus administration of intravenous contrast. Multiplanar CT image reconstructions and MIPs were obtained to evaluate the vascular anatomy. Carotid stenosis measurements (when applicable) are obtained utilizing NASCET criteria, using the distal internal carotid diameter as the denominator. CONTRAST:  195m OMNIPAQUE IOHEXOL 350 MG/ML SOLN COMPARISON:  Plain head CT 1100 hours today. FINDINGS: CT HEAD Brain: Gray-white matter differentiation remains within normal limits. Stable non contrast CT appearance of the brain. No acute intracranial abnormality. Calvarium and skull base: Negative. Paranasal sinuses: Stable lobulated mucosal thickening in the left sphenoid sinus. Other Visualized paranasal sinuses and mastoids are stable and well pneumatized. Orbits: Visualized orbits and scalp soft tissues are within normal limits. CTA NECK Skeleton: Absent maxillary dentition. No acute osseous abnormality identified. Upper chest: Dependent ground-glass opacity in both lungs appear symmetric and might be atelectasis. No superimposed mediastinal or hilar lymphadenopathy, pleural or pericardial effusion. Visible central pulmonary arteries appear patent. Other neck: Negative. Aortic arch: 3 vessel arch configuration with no arch atherosclerosis. Right carotid system: Negative. Mildly tortuous cervical right ICA. Left carotid system: Negative. Vertebral arteries: Normal proximal right subclavian artery and right vertebral artery origins. The right vertebral artery appears dominant and is patent to the skull base without plaque or stenosis. Normal proximal left subclavian artery and left vertebral artery origin. The left vertebral artery is mildly non dominant and patent to the skull base without plaque or  stenosis. CTA HEAD Posterior circulation: No distal vertebral artery plaque or stenosis. Normal PICA origins, vertebrobasilar junction. The right V4 segment is dominant. Patent basilar artery without stenosis. Normal SCA and PCA origins. Posterior communicating arteries are diminutive or absent. Bilateral PCA branches are within normal limits. Anterior circulation: Both ICA siphons are patent. On the left there is no siphon plaque or stenosis. Likewise the right siphon appears patent and normal. The right ophthalmic artery origin appears normal on series 13, image 87. Patent carotid termini. Normal MCA and ACA origins. Anterior communicating artery and bilateral ACA branches are within normal limits. Left MCA M1 bifurcates early without stenosis. Right MCA M1 segment and bifurcation are patent without stenosis. Bilateral MCA branches are within normal limits. Venous sinuses: Patent. Anatomic variants: Dominant right vertebral artery. Review of the MIP images confirms the above findings IMPRESSION: 1. No large vessel occlusion and normal arterial findings on CTA Head and Neck. 2. Stable and negative  CT appearance of the brain. 3. Bilateral pulmonary ground-glass opacity in the visible lungs is favored to be atelectasis rather than alveolar edema or infection. And otherwise negative visible upper chest. Electronically Signed   By: Genevie Ann M.D.   On: 03/28/2019 16:27   CT Head Wo Contrast  Result Date: 03/28/2019 CLINICAL DATA:  Focal neuro deficit, greater than 6 hours, stroke suspected. Additional history provided: Loss vision on Friday, headache behind left eye, double vision right eye, left arm and left leg numbness. EXAM: CT HEAD WITHOUT CONTRAST TECHNIQUE: Contiguous axial images were obtained from the base of the skull through the vertex without intravenous contrast. COMPARISON:  No pertinent prior studies available for comparison. FINDINGS: Brain: No evidence of acute intracranial hemorrhage. No demarcated  cortical infarction. No evidence of intracranial mass. No midline shift or extra-axial fluid collection. There is minimal scattered nonspecific white matter hypodensity. Cerebral volume is normal for age. Vascular: No hyperdense vessel. Skull: Normal. Negative for fracture or focal lesion. Sinuses/Orbits: Visualized orbits demonstrate no acute abnormality. Moderate-sized left sphenoid sinus mucous retention cyst. IMPRESSION: No CT evidence of acute intracranial abnormality. Given provided history, consider brain MRI for further evaluation. Minimal nonspecific scattered white matter disease. Left sphenoid sinus mucous retention cysts Electronically Signed   By: Kellie Simmering DO   On: 03/28/2019 11:10   CT Angio Neck W and/or Wo Contrast  Result Date: 03/28/2019 CLINICAL DATA:  44 year old female with neurologic deficits including loss of vision, double vision, left extremity numbness and tingling. EXAM: CT ANGIOGRAPHY HEAD AND NECK TECHNIQUE: Multidetector CT imaging of the head and neck was performed using the standard protocol during bolus administration of intravenous contrast. Multiplanar CT image reconstructions and MIPs were obtained to evaluate the vascular anatomy. Carotid stenosis measurements (when applicable) are obtained utilizing NASCET criteria, using the distal internal carotid diameter as the denominator. CONTRAST:  197m OMNIPAQUE IOHEXOL 350 MG/ML SOLN COMPARISON:  Plain head CT 1100 hours today. FINDINGS: CT HEAD Brain: Gray-white matter differentiation remains within normal limits. Stable non contrast CT appearance of the brain. No acute intracranial abnormality. Calvarium and skull base: Negative. Paranasal sinuses: Stable lobulated mucosal thickening in the left sphenoid sinus. Other Visualized paranasal sinuses and mastoids are stable and well pneumatized. Orbits: Visualized orbits and scalp soft tissues are within normal limits. CTA NECK Skeleton: Absent maxillary dentition. No acute osseous  abnormality identified. Upper chest: Dependent ground-glass opacity in both lungs appear symmetric and might be atelectasis. No superimposed mediastinal or hilar lymphadenopathy, pleural or pericardial effusion. Visible central pulmonary arteries appear patent. Other neck: Negative. Aortic arch: 3 vessel arch configuration with no arch atherosclerosis. Right carotid system: Negative. Mildly tortuous cervical right ICA. Left carotid system: Negative. Vertebral arteries: Normal proximal right subclavian artery and right vertebral artery origins. The right vertebral artery appears dominant and is patent to the skull base without plaque or stenosis. Normal proximal left subclavian artery and left vertebral artery origin. The left vertebral artery is mildly non dominant and patent to the skull base without plaque or stenosis. CTA HEAD Posterior circulation: No distal vertebral artery plaque or stenosis. Normal PICA origins, vertebrobasilar junction. The right V4 segment is dominant. Patent basilar artery without stenosis. Normal SCA and PCA origins. Posterior communicating arteries are diminutive or absent. Bilateral PCA branches are within normal limits. Anterior circulation: Both ICA siphons are patent. On the left there is no siphon plaque or stenosis. Likewise the right siphon appears patent and normal. The right ophthalmic artery origin appears normal on  series 13, image 87. Patent carotid termini. Normal MCA and ACA origins. Anterior communicating artery and bilateral ACA branches are within normal limits. Left MCA M1 bifurcates early without stenosis. Right MCA M1 segment and bifurcation are patent without stenosis. Bilateral MCA branches are within normal limits. Venous sinuses: Patent. Anatomic variants: Dominant right vertebral artery. Review of the MIP images confirms the above findings IMPRESSION: 1. No large vessel occlusion and normal arterial findings on CTA Head and Neck. 2. Stable and negative CT  appearance of the brain. 3. Bilateral pulmonary ground-glass opacity in the visible lungs is favored to be atelectasis rather than alveolar edema or infection. And otherwise negative visible upper chest. Electronically Signed   By: Genevie Ann M.D.   On: 03/28/2019 16:27    EKG: Independently reviewed.  Normal sinus rhythm with no significant ST changes  Assessment/Plan Active Problems:   TIA (transient ischemic attack)     #1 TIA: Symptoms have resolved.  We will complete work-up including echocardiograms carotid Dopplers.  PT OT evaluation.  May need to continue aspirin and statin.  Neurology already consulted for advice.   DVT prophylaxis: Lovenox Code Status: Full code Family Communication: No family Disposition Plan: Discharge home most likely Consults called: Dr. Cheral Marker of neurology Admission status: Observation  Severity of Illness: The appropriate patient status for this patient is OBSERVATION. Observation status is judged to be reasonable and necessary in order to provide the required intensity of service to ensure the patient's safety. The patient's presenting symptoms, physical exam findings, and initial radiographic and laboratory data in the context of their medical condition is felt to place them at decreased risk for further clinical deterioration. Furthermore, it is anticipated that the patient will be medically stable for discharge from the hospital within 2 midnights of admission. The following factors support the patient status of observation.   " The patient's presenting symptoms include left-sided weakness which has resolved. " The physical exam findings include no significant findings except for anxiety. " The initial radiographic and laboratory data are so far all normal.     Breanna Wise,LAWAL MD [facility] Pager [phone]  If 7PM-7AM, please contact night-coverage [email or web address] Password [email or web address]  03/28/2019, 7:02 PM

## 2019-03-28 NOTE — ED Provider Notes (Addendum)
  Physical Exam  BP 112/71 (BP Location: Right Arm)   Pulse 72   Temp 98.7 F (37.1 C) (Oral)   Resp 16   Ht 5\' 3"  (1.6 m)   Wt 81.6 kg   LMP 03/09/2019   SpO2 97%   BMI 31.89 kg/m   Physical Exam Vitals and nursing note reviewed.  Constitutional:      Appearance: Normal appearance.  HENT:     Head: Normocephalic.  Eyes:     Conjunctiva/sclera: Conjunctivae normal.  Pulmonary:     Effort: Pulmonary effort is normal.  Skin:    General: Skin is dry.  Neurological:     General: No focal deficit present.     Mental Status: She is alert.     Cranial Nerves: No cranial nerve deficit or dysarthria.  Psychiatric:        Mood and Affect: Mood normal.     ED Course/Procedures   Clinical Course as of Mar 27 1920  Tue Mar 28, 2019  1431 Dr. Mar 30, 2019 - who recommends MRI/MRA Brain for further evaluation. Pt had relayed to him that she is also having eye pain; will proceed with eye exam prior to admission for stroke work up   [MV]  1511 Discussed case with Dr. Ezzard Standing for ED to ED transfer so that pt can receive her MRI/MRA in a timely manner; recommends CTA head and neck prior to transfer.    [MV]  1840 Patient to be admitted by hospitalist Dr. Rhunette Croft for further workup per neuro recommendations.    [KM]    Clinical Course User Index [KM] Mikeal Hawthorne, PA-C [MV] Arlyn Dunning, PA-C    Procedures  MDM  Patient is transferred here from [facility] for further workup. See previous PA notes for full details. Patient had teleneuro visit prior to arrival with Dr. Tanda Rockers who recommended admit for further workup for ?stroke vs inflammatory vs demyelinating disorder. Patient having transient left sided symptoms. Currently reports feeling heaviness in her L arm and leg and vision "coming and going" in the left eye.  Otherwise stable.       Ezzard Standing, PA-C 03/28/19 1922    05/26/19 03/28/19 05/26/19, MD 04/05/19 1000

## 2019-03-28 NOTE — ED Notes (Signed)
Pt transferred from [facility] for MRI. Pt c/o left arm numbness and tingling since Friday. A&O x 4.

## 2019-03-28 NOTE — ED Notes (Signed)
[facility] arrived to transport [facility] to [facility] 

## 2019-03-29 ENCOUNTER — Observation Stay (HOSPITAL_BASED_OUTPATIENT_CLINIC_OR_DEPARTMENT_OTHER): Payer: Medicaid Other

## 2019-03-29 ENCOUNTER — Encounter (HOSPITAL_COMMUNITY): Payer: Medicaid Other

## 2019-03-29 DIAGNOSIS — G459 Transient cerebral ischemic attack, unspecified: Secondary | ICD-10-CM | POA: Diagnosis not present

## 2019-03-29 LAB — COMPREHENSIVE METABOLIC PANEL
ALT: 36 U/L (ref 0–44)
AST: 31 U/L (ref 15–41)
Albumin: 2.9 g/dL — ABNORMAL LOW (ref 3.5–5.0)
Alkaline Phosphatase: 81 U/L (ref 38–126)
Anion gap: 5 (ref 5–15)
BUN: 8 mg/dL (ref 6–20)
CO2: 24 mmol/L (ref 22–32)
Calcium: 8.3 mg/dL — ABNORMAL LOW (ref 8.9–10.3)
Chloride: 110 mmol/L (ref 98–111)
Creatinine, Ser: 0.63 mg/dL (ref 0.44–1.00)
GFR calc Af Amer: >60 mL/min (ref >60)
GFR calc non Af Amer: >60 mL/min (ref >60)
Glucose, Bld: 101 mg/dL — ABNORMAL HIGH (ref 70–99)
Potassium: 3.7 mmol/L (ref 3.5–5.1)
Sodium: 139 mmol/L (ref 135–145)
Total Bilirubin: 0.3 mg/dL (ref 0.3–1.2)
Total Protein: 5.9 g/dL — ABNORMAL LOW (ref 6.5–8.1)

## 2019-03-29 LAB — LIPID PANEL
Cholesterol: 186 mg/dL (ref 0–200)
HDL: 29 mg/dL — ABNORMAL LOW (ref >40)
LDL Cholesterol: 127 mg/dL — ABNORMAL HIGH (ref 0–99)
Total CHOL/HDL Ratio: 6.4 RATIO
Triglycerides: 152 mg/dL — ABNORMAL HIGH (ref <150)
VLDL: 30 mg/dL (ref 0–40)

## 2019-03-29 LAB — CBC
HCT: 39.9 % (ref 36.0–46.0)
Hemoglobin: 13 g/dL (ref 12.0–15.0)
MCH: 28.9 pg (ref 26.0–34.0)
MCHC: 32.6 g/dL (ref 30.0–36.0)
MCV: 88.7 fL (ref 80.0–100.0)
Platelets: 193 10*3/uL (ref 150–400)
RBC: 4.5 MIL/uL (ref 3.87–5.11)
RDW: 13.4 % (ref 11.5–15.5)
WBC: 5.8 10*3/uL (ref 4.0–10.5)
nRBC: 0 % (ref 0.0–0.2)

## 2019-03-29 LAB — ECHOCARDIOGRAM COMPLETE
Height: 63 in
Weight: 2880 oz

## 2019-03-29 LAB — HEMOGLOBIN A1C
Hgb A1c MFr Bld: 5.4 % (ref 4.8–5.6)
Mean Plasma Glucose: 108.28 mg/dL

## 2019-03-29 NOTE — ED Notes (Signed)
Pt moved to hospital bed for comfort.

## 2019-03-29 NOTE — Consult Note (Signed)
Stroke Neurology Consultation Note  Consult Requested by: Dr. Lonny Prude, hospitalist after pt seen by teleneurology at [facility]   Reason for Consult: loss of vision, diplopia  Consult Date:  03/29/19  History of Present Illness:  Breanna Wise is an 44 y.o.  female who developed transient vision loss on Friday 03/24/2019 seeing bright lights accompanied by facial weakness.  The symptoms persisted through the weekend hence she presented to her PCP on Monday 03/27/2019 for evaluation. She has chronic neck pain with MRI LS done ~ 1 wk ago at [facility] which showed only minor degenerative changes..She presented to [facility] where teleneurology saw her. She was not a tPA candidate as she was out of the window. CT neg. she was thought by telemetry neurologist to have a TIA and was transferred to [facility] where MRI was neg for stroke.  She states her double vision and slurred speech and facial weakness have resolved completely.  She does complain of some chronic neck and back pain as well as radicular pain.  She still has some residual subjective numbness in L arm and leg  with ongoing back pain.  And mild gait difficulty.  Patient states that she has significant underlying anxiety and stress due to family issues.  Her daughter needs emergency surgery and has a newborn and the patient wants to be at home to be there for her family.  She is refusing to stay in the hospital any longer and undergo MRI scan of cervical and thoracic spine which I am recommending.  Past Medical History:  Diagnosis Date  . Back pain   . Depression   . Scoliosis     Past Surgical History:  Procedure Laterality Date  . BACK SURGERY    . CHOLECYSTECTOMY     No family history on file.   Social History:  reports that she has been smoking. She has never used smokeless tobacco. She reports current alcohol use. She reports that she does not use drugs.  Review of Systems: A full 14  system ROS was attempted today and was able to be performed.  And is negative except with pertinent responses as per HPI.  Allergies:  Allergies  Allergen Reactions  . Amoxicillin Anaphylaxis  . Fluoxetine Hives  . Methocarbamol Shortness Of Breath  . Penicillins Anaphylaxis and Swelling  . Rocephin [Ceftriaxone Sodium In Dextrose] Swelling    Medications: I have reviewed the patient's current medications.  Test Results: CBC:  Recent Labs  Lab 03/28/19 2042 03/29/19 0408  WBC 6.0 5.8  HGB 13.1 13.0  HCT 40.0 39.9  MCV 88.1 88.7  PLT 204 846   Basic Metabolic Panel:  Recent Labs  Lab 03/28/19 1038 03/28/19 2042 03/29/19 0408  NA 137  --  139  K 3.8  --  3.7  CL 105  --  110  CO2 23  --  24  GLUCOSE 109*  --  101*  BUN 9  --  8  CREATININE 0.68 0.71 0.63  CALCIUM 8.9  --  8.3*   Liver Function Tests: Recent Labs  Lab 03/29/19 0408  AST 31  ALT 36  ALKPHOS 81  BILITOT 0.3  PROT 5.9*  ALBUMIN 2.9*   Urinalysis:  Recent Labs  Lab 03/28/19 1038  COLORURINE YELLOW  LABSPEC 1.020  PHURINE 6.5  GLUCOSEU NEGATIVE  HGBUR NEGATIVE  BILIRUBINUR NEGATIVE  KETONESUR NEGATIVE  PROTEINUR NEGATIVE  NITRITE NEGATIVE  LEUKOCYTESUR NEGATIVE  Microbiology:  Results for orders placed or performed during the hospital encounter of 03/28/19  SARS CORONAVIRUS 2 (TAT 6-24 HRS) Nasopharyngeal Nasopharyngeal Swab     Status: None   Collection Time: 03/28/19  1:45 PM   Specimen: Nasopharyngeal Swab  Result Value Ref Range Status   SARS Coronavirus 2 NEGATIVE NEGATIVE Final    Comment: (NOTE) SARS-CoV-2 target nucleic acids are NOT DETECTED. The SARS-CoV-2 RNA is generally detectable in upper and lower respiratory specimens during the acute phase of infection. Negative results do not preclude SARS-CoV-2 infection, do not rule out co-infections with other pathogens, and should not be used as the sole basis for treatment or other patient management decisions. Negative  results must be combined with clinical observations, patient history, and epidemiological information. The expected result is Negative. Fact Sheet for Patients: SugarRoll.be Fact Sheet for Healthcare Providers: https://www.woods-mathews.com/ This test is not yet approved or cleared by the [place] FDA and  has been authorized for detection and/or diagnosis of SARS-CoV-2 by FDA under an Emergency Use Authorization (EUA). This EUA will remain  in effect (meaning this test can be used) for the duration of the COVID-19 declaration under Section 56 4(b)(1) of the Act, 21 U.S.C. section 360bbb-3(b)(1), unless the authorization is terminated or revoked sooner. Performed at [facility], [place] [address], [place], [place] [address]   SARS Coronavirus 2 Ag (30 min TAT) - Nasal Swab (BD Veritor Kit)     Status: None   Collection Time: 03/28/19  4:40 PM   Specimen: Nasal Swab (BD Veritor Kit)  Result Value Ref Range Status   SARS Coronavirus 2 Ag NEGATIVE NEGATIVE Final    Comment: (NOTE) SARS-CoV-2 antigen NOT DETECTED.  Negative results are presumptive.  Negative results do not preclude SARS-CoV-2 infection and should not be used as the sole basis for treatment or other patient management decisions, including infection  control decisions, particularly in the presence of clinical signs and  symptoms consistent with COVID-19, or in those who have been in contact with the virus.  Negative results must be combined with clinical observations, patient history, and epidemiological information. The expected result is Negative. Fact Sheet for Patients: PodPark.tn Fact Sheet for Healthcare Providers: GiftContent.is This test is not yet approved or cleared by the [place] FDA and  has been authorized for detection and/or diagnosis of SARS-CoV-2 by FDA under an Emergency Use Authorization  (EUA).  This EUA will remain in effect (meaning this test can be used) for the duration of  the COVID-19 de claration under Section 564(b)(1) of the Act, 21 U.S.C. section 360bbb-3(b)(1), unless the authorization is terminated or revoked sooner. Performed at [facility], [address] [place], [place] [address]    Lipid Panel:     Component Value Date/Time   CHOL 186 03/29/2019 0408   TRIG 152 (H) 03/29/2019 0408   HDL 29 (L) 03/29/2019 0408   CHOLHDL 6.4 03/29/2019 0408   VLDL 30 03/29/2019 0408   LDLCALC 127 (H) 03/29/2019 0408   HgbA1c:  Lab Results  Component Value Date   HGBA1C 5.4 03/29/2019   CT Angio Head W or Wo Contrast  Result Date: 03/28/2019 CLINICAL DATA:  44 year old female with neurologic deficits including loss of vision, double vision, left extremity numbness and tingling. EXAM: CT ANGIOGRAPHY HEAD AND NECK TECHNIQUE: Multidetector CT imaging of the head and neck was performed using the standard protocol during bolus administration of intravenous contrast. Multiplanar CT image reconstructions and MIPs were  obtained to evaluate the vascular anatomy. Carotid stenosis measurements (when applicable) are obtained utilizing NASCET criteria, using the distal internal carotid diameter as the denominator. CONTRAST:  134m OMNIPAQUE IOHEXOL 350 MG/ML SOLN COMPARISON:  Plain head CT 1100 hours today. FINDINGS: CT HEAD Brain: Gray-white matter differentiation remains within normal limits. Stable non contrast CT appearance of the brain. No acute intracranial abnormality. Calvarium and skull base: Negative. Paranasal sinuses: Stable lobulated mucosal thickening in the left sphenoid sinus. Other Visualized paranasal sinuses and mastoids are stable and well pneumatized. Orbits: Visualized orbits and scalp soft tissues are within normal limits. CTA NECK Skeleton: Absent maxillary dentition. No acute osseous abnormality identified. Upper chest: Dependent ground-glass opacity in  both lungs appear symmetric and might be atelectasis. No superimposed mediastinal or hilar lymphadenopathy, pleural or pericardial effusion. Visible central pulmonary arteries appear patent. Other neck: Negative. Aortic arch: 3 vessel arch configuration with no arch atherosclerosis. Right carotid system: Negative. Mildly tortuous cervical right ICA. Left carotid system: Negative. Vertebral arteries: Normal proximal right subclavian artery and right vertebral artery origins. The right vertebral artery appears dominant and is patent to the skull base without plaque or stenosis. Normal proximal left subclavian artery and left vertebral artery origin. The left vertebral artery is mildly non dominant and patent to the skull base without plaque or stenosis. CTA HEAD Posterior circulation: No distal vertebral artery plaque or stenosis. Normal PICA origins, vertebrobasilar junction. The right V4 segment is dominant. Patent basilar artery without stenosis. Normal SCA and PCA origins. Posterior communicating arteries are diminutive or absent. Bilateral PCA branches are within normal limits. Anterior circulation: Both ICA siphons are patent. On the left there is no siphon plaque or stenosis. Likewise the right siphon appears patent and normal. The right ophthalmic artery origin appears normal on series 13, image 87. Patent carotid termini. Normal MCA and ACA origins. Anterior communicating artery and bilateral ACA branches are within normal limits. Left MCA M1 bifurcates early without stenosis. Right MCA M1 segment and bifurcation are patent without stenosis. Bilateral MCA branches are within normal limits. Venous sinuses: Patent. Anatomic variants: Dominant right vertebral artery. Review of the MIP images confirms the above findings IMPRESSION: 1. No large vessel occlusion and normal arterial findings on CTA Head and Neck. 2. Stable and negative CT appearance of the brain. 3. Bilateral pulmonary ground-glass opacity in the  visible lungs is favored to be atelectasis rather than alveolar edema or infection. And otherwise negative visible upper chest. Electronically Signed   By: HGenevie AnnM.D.   On: 03/28/2019 16:27   CT Head Wo Contrast  Result Date: 03/28/2019 CLINICAL DATA:  Focal neuro deficit, greater than 6 hours, stroke suspected. Additional history provided: Loss vision on Friday, headache behind left eye, double vision right eye, left arm and left leg numbness. EXAM: CT HEAD WITHOUT CONTRAST TECHNIQUE: Contiguous axial images were obtained from the base of the skull through the vertex without intravenous contrast. COMPARISON:  No pertinent prior studies available for comparison. FINDINGS: Brain: No evidence of acute intracranial hemorrhage. No demarcated cortical infarction. No evidence of intracranial mass. No midline shift or extra-axial fluid collection. There is minimal scattered nonspecific white matter hypodensity. Cerebral volume is normal for age. Vascular: No hyperdense vessel. Skull: Normal. Negative for fracture or focal lesion. Sinuses/Orbits: Visualized orbits demonstrate no acute abnormality. Moderate-sized left sphenoid sinus mucous retention cyst. IMPRESSION: No CT evidence of acute intracranial abnormality. Given provided history, consider brain MRI for further evaluation. Minimal nonspecific scattered white matter disease. Left sphenoid  sinus mucous retention cysts Electronically Signed   By: Kellie Simmering DO   On: 03/28/2019 11:10   CT Angio Neck W and/or Wo Contrast  Result Date: 03/28/2019 CLINICAL DATA:  44 year old female with neurologic deficits including loss of vision, double vision, left extremity numbness and tingling. EXAM: CT ANGIOGRAPHY HEAD AND NECK TECHNIQUE: Multidetector CT imaging of the head and neck was performed using the standard protocol during bolus administration of intravenous contrast. Multiplanar CT image reconstructions and MIPs were obtained to evaluate the vascular anatomy.  Carotid stenosis measurements (when applicable) are obtained utilizing NASCET criteria, using the distal internal carotid diameter as the denominator. CONTRAST:  180m OMNIPAQUE IOHEXOL 350 MG/ML SOLN COMPARISON:  Plain head CT 1100 hours today. FINDINGS: CT HEAD Brain: Gray-white matter differentiation remains within normal limits. Stable non contrast CT appearance of the brain. No acute intracranial abnormality. Calvarium and skull base: Negative. Paranasal sinuses: Stable lobulated mucosal thickening in the left sphenoid sinus. Other Visualized paranasal sinuses and mastoids are stable and well pneumatized. Orbits: Visualized orbits and scalp soft tissues are within normal limits. CTA NECK Skeleton: Absent maxillary dentition. No acute osseous abnormality identified. Upper chest: Dependent ground-glass opacity in both lungs appear symmetric and might be atelectasis. No superimposed mediastinal or hilar lymphadenopathy, pleural or pericardial effusion. Visible central pulmonary arteries appear patent. Other neck: Negative. Aortic arch: 3 vessel arch configuration with no arch atherosclerosis. Right carotid system: Negative. Mildly tortuous cervical right ICA. Left carotid system: Negative. Vertebral arteries: Normal proximal right subclavian artery and right vertebral artery origins. The right vertebral artery appears dominant and is patent to the skull base without plaque or stenosis. Normal proximal left subclavian artery and left vertebral artery origin. The left vertebral artery is mildly non dominant and patent to the skull base without plaque or stenosis. CTA HEAD Posterior circulation: No distal vertebral artery plaque or stenosis. Normal PICA origins, vertebrobasilar junction. The right V4 segment is dominant. Patent basilar artery without stenosis. Normal SCA and PCA origins. Posterior communicating arteries are diminutive or absent. Bilateral PCA branches are within normal limits. Anterior circulation:  Both ICA siphons are patent. On the left there is no siphon plaque or stenosis. Likewise the right siphon appears patent and normal. The right ophthalmic artery origin appears normal on series 13, image 87. Patent carotid termini. Normal MCA and ACA origins. Anterior communicating artery and bilateral ACA branches are within normal limits. Left MCA M1 bifurcates early without stenosis. Right MCA M1 segment and bifurcation are patent without stenosis. Bilateral MCA branches are within normal limits. Venous sinuses: Patent. Anatomic variants: Dominant right vertebral artery. Review of the MIP images confirms the above findings IMPRESSION: 1. No large vessel occlusion and normal arterial findings on CTA Head and Neck. 2. Stable and negative CT appearance of the brain. 3. Bilateral pulmonary ground-glass opacity in the visible lungs is favored to be atelectasis rather than alveolar edema or infection. And otherwise negative visible upper chest. Electronically Signed   By: HGenevie AnnM.D.   On: 03/28/2019 16:27   MR ANGIO HEAD WO CONTRAST  Result Date: 03/28/2019 CLINICAL DATA:  Left-sided tingling and weakness for 5 days. Sudden onset left eye vision loss with right eye diplopia. EXAM: MRI HEAD WITHOUT AND WITH CONTRAST MRA HEAD WITHOUT CONTRAST MRA NECK WITHOUT AND WITH CONTRAST TECHNIQUE: Multiplanar, multiecho pulse sequences of the brain and surrounding structures were obtained without and with intravenous contrast. Angiographic images of the Circle of Willis were obtained using MRA technique without intravenous  contrast. Angiographic images of the neck were obtained using MRA technique without and with intravenous contrast. Carotid stenosis measurements (when applicable) are obtained utilizing NASCET criteria, using the distal internal carotid diameter as the denominator. CONTRAST:  50m GADAVIST GADOBUTROL 1 MMOL/ML IV SOLN COMPARISON:  CTA head and neck 03/28/2019 FINDINGS: MRI HEAD FINDINGS BRAIN: The midline  structures are normal. There is no acute infarct, acute hemorrhage or mass. The white matter signal is normal for the patient's age. The CSF spaces are normal for age, with no hydrocephalus. Blood-sensitive sequences show no chronic microhemorrhage or superficial siderosis. No abnormal contrast enhancement. SKULL AND UPPER CERVICAL SPINE: The visualized skull base, calvarium, upper cervical spine and extracranial soft tissues are normal. SINUSES/ORBITS: No fluid levels or advanced mucosal thickening. No mastoid or middle ear effusion. The orbits are normal. MRA HEAD FINDINGS POSTERIOR CIRCULATION: --Vertebral arteries: Normal right dominant configuration of V4 segments. --Posterior inferior cerebellar arteries (PICA): Patent origins from the vertebral arteries. --Anterior inferior cerebellar arteries (AICA): Patent origins from the basilar artery. --Basilar artery: Normal. --Superior cerebellar arteries: Normal. --Posterior cerebral arteries: Normal. Both originate from the basilar artery. Posterior communicating arteries (p-comm) are diminutive or absent. ANTERIOR CIRCULATION: --Intracranial internal carotid arteries: Normal. --Anterior cerebral arteries (ACA): Normal. Both A1 segments are present. Patent anterior communicating artery (a-comm). --Middle cerebral arteries (MCA): Normal. MRA NECK FINDINGS Aortic arch: Normal 3 vessel aortic branching pattern. The visualized subclavian arteries are normal. Right carotid system: Normal course and caliber without stenosis or evidence of dissection. Left carotid system: Normal course and caliber without stenosis or evidence of dissection. Vertebral arteries: Left dominant. Vertebral artery origins are normal. Vertebral arteries are normal in course and caliber to the vertebrobasilar confluence without stenosis or evidence of dissection. IMPRESSION: 1. Normal MRI of the brain. 2. Normal MRA of the head and neck. Electronically Signed   By: KUlyses JarredM.D.   On:  03/28/2019 20:02   MR Angiogram Neck W or Wo Contrast  Result Date: 03/28/2019 CLINICAL DATA:  Left-sided tingling and weakness for 5 days. Sudden onset left eye vision loss with right eye diplopia. EXAM: MRI HEAD WITHOUT AND WITH CONTRAST MRA HEAD WITHOUT CONTRAST MRA NECK WITHOUT AND WITH CONTRAST TECHNIQUE: Multiplanar, multiecho pulse sequences of the brain and surrounding structures were obtained without and with intravenous contrast. Angiographic images of the Circle of Willis were obtained using MRA technique without intravenous contrast. Angiographic images of the neck were obtained using MRA technique without and with intravenous contrast. Carotid stenosis measurements (when applicable) are obtained utilizing NASCET criteria, using the distal internal carotid diameter as the denominator. CONTRAST:  840mGADAVIST GADOBUTROL 1 MMOL/ML IV SOLN COMPARISON:  CTA head and neck 03/28/2019 FINDINGS: MRI HEAD FINDINGS BRAIN: The midline structures are normal. There is no acute infarct, acute hemorrhage or mass. The white matter signal is normal for the patient's age. The CSF spaces are normal for age, with no hydrocephalus. Blood-sensitive sequences show no chronic microhemorrhage or superficial siderosis. No abnormal contrast enhancement. SKULL AND UPPER CERVICAL SPINE: The visualized skull base, calvarium, upper cervical spine and extracranial soft tissues are normal. SINUSES/ORBITS: No fluid levels or advanced mucosal thickening. No mastoid or middle ear effusion. The orbits are normal. MRA HEAD FINDINGS POSTERIOR CIRCULATION: --Vertebral arteries: Normal right dominant configuration of V4 segments. --Posterior inferior cerebellar arteries (PICA): Patent origins from the vertebral arteries. --Anterior inferior cerebellar arteries (AICA): Patent origins from the basilar artery. --Basilar artery: Normal. --Superior cerebellar arteries: Normal. --Posterior cerebral arteries: Normal. Both originate  from the basilar  artery. Posterior communicating arteries (p-comm) are diminutive or absent. ANTERIOR CIRCULATION: --Intracranial internal carotid arteries: Normal. --Anterior cerebral arteries (ACA): Normal. Both A1 segments are present. Patent anterior communicating artery (a-comm). --Middle cerebral arteries (MCA): Normal. MRA NECK FINDINGS Aortic arch: Normal 3 vessel aortic branching pattern. The visualized subclavian arteries are normal. Right carotid system: Normal course and caliber without stenosis or evidence of dissection. Left carotid system: Normal course and caliber without stenosis or evidence of dissection. Vertebral arteries: Left dominant. Vertebral artery origins are normal. Vertebral arteries are normal in course and caliber to the vertebrobasilar confluence without stenosis or evidence of dissection. IMPRESSION: 1. Normal MRI of the brain. 2. Normal MRA of the head and neck. Electronically Signed   By: Ulyses Jarred M.D.   On: 03/28/2019 20:02   MR Brain W and Wo Contrast  Result Date: 03/28/2019 CLINICAL DATA:  Left-sided tingling and weakness for 5 days. Sudden onset left eye vision loss with right eye diplopia. EXAM: MRI HEAD WITHOUT AND WITH CONTRAST MRA HEAD WITHOUT CONTRAST MRA NECK WITHOUT AND WITH CONTRAST TECHNIQUE: Multiplanar, multiecho pulse sequences of the brain and surrounding structures were obtained without and with intravenous contrast. Angiographic images of the Circle of Willis were obtained using MRA technique without intravenous contrast. Angiographic images of the neck were obtained using MRA technique without and with intravenous contrast. Carotid stenosis measurements (when applicable) are obtained utilizing NASCET criteria, using the distal internal carotid diameter as the denominator. CONTRAST:  75m GADAVIST GADOBUTROL 1 MMOL/ML IV SOLN COMPARISON:  CTA head and neck 03/28/2019 FINDINGS: MRI HEAD FINDINGS BRAIN: The midline structures are normal. There is no acute infarct, acute  hemorrhage or mass. The white matter signal is normal for the patient's age. The CSF spaces are normal for age, with no hydrocephalus. Blood-sensitive sequences show no chronic microhemorrhage or superficial siderosis. No abnormal contrast enhancement. SKULL AND UPPER CERVICAL SPINE: The visualized skull base, calvarium, upper cervical spine and extracranial soft tissues are normal. SINUSES/ORBITS: No fluid levels or advanced mucosal thickening. No mastoid or middle ear effusion. The orbits are normal. MRA HEAD FINDINGS POSTERIOR CIRCULATION: --Vertebral arteries: Normal right dominant configuration of V4 segments. --Posterior inferior cerebellar arteries (PICA): Patent origins from the vertebral arteries. --Anterior inferior cerebellar arteries (AICA): Patent origins from the basilar artery. --Basilar artery: Normal. --Superior cerebellar arteries: Normal. --Posterior cerebral arteries: Normal. Both originate from the basilar artery. Posterior communicating arteries (p-comm) are diminutive or absent. ANTERIOR CIRCULATION: --Intracranial internal carotid arteries: Normal. --Anterior cerebral arteries (ACA): Normal. Both A1 segments are present. Patent anterior communicating artery (a-comm). --Middle cerebral arteries (MCA): Normal. MRA NECK FINDINGS Aortic arch: Normal 3 vessel aortic branching pattern. The visualized subclavian arteries are normal. Right carotid system: Normal course and caliber without stenosis or evidence of dissection. Left carotid system: Normal course and caliber without stenosis or evidence of dissection. Vertebral arteries: Left dominant. Vertebral artery origins are normal. Vertebral arteries are normal in course and caliber to the vertebrobasilar confluence without stenosis or evidence of dissection. IMPRESSION: 1. Normal MRI of the brain. 2. Normal MRA of the head and neck. Electronically Signed   By: KUlyses JarredM.D.   On: 03/28/2019 20:02   ECHOCARDIOGRAM COMPLETE  Result Date:  03/29/2019   ECHOCARDIOGRAM REPORT   Patient Name:   Breanna SENECADate of Exam: 03/29/2019 Medical Rec #:  [id number]      Height:       63.0 in Accession #:    [id number]  Weight:       180.0 lb Date of Birth:  1976/03/14       BSA:          1.85 m Patient Age:    43 years        BP:           101/75 mmHg Patient Gender: F               HR:           72 bpm. Exam Location:  Inpatient Procedure: 2D Echo, Cardiac Doppler and Color Doppler Indications:    TIA  History:        Patient has no prior history of Echocardiogram examinations.                 Signs/Symptoms:Chest Pain.  Sonographer:    Roseanna Rainbow RDCS Referring Phys: [place]  1. Left ventricular ejection fraction, by visual estimation, is 60 to 65%. The left ventricle has normal function. There is no left ventricular hypertrophy.  2. The left ventricle has no regional wall motion abnormalities.  3. Global right ventricle has normal systolic function.The right ventricular size is normal. No increase in right ventricular wall thickness.  4. Left atrial size was normal.  5. Right atrial size was normal.  6. The mitral valve is normal in structure. No evidence of mitral valve regurgitation.  7. The tricuspid valve is normal in structure.  8. The aortic valve is tricuspid. Aortic valve regurgitation is not visualized. No evidence of aortic valve sclerosis or stenosis.  9. The pulmonic valve was not well visualized. Pulmonic valve regurgitation is not visualized. 10. TR signal is inadequate for assessing pulmonary artery systolic pressure. 11. The inferior vena cava is normal in size with greater than 50% respiratory variability, suggesting right atrial pressure of 3 mmHg. FINDINGS  Left Ventricle: Left ventricular ejection fraction, by visual estimation, is 60 to 65%. The left ventricle has normal function. The left ventricle has no regional wall motion abnormalities. The left ventricular internal cavity size was the left ventricle is  normal in size. There is no left ventricular hypertrophy. Left ventricular diastolic parameters were normal. Right Ventricle: The right ventricular size is normal. No increase in right ventricular wall thickness. Global RV systolic function is has normal systolic function. Left Atrium: Left atrial size was normal in size. Right Atrium: Right atrial size was normal in size Pericardium: There is no evidence of pericardial effusion. Mitral Valve: The mitral valve is normal in structure. No evidence of mitral valve regurgitation. Tricuspid Valve: The tricuspid valve is normal in structure. Tricuspid valve regurgitation is not demonstrated. Aortic Valve: The aortic valve is tricuspid. Aortic valve regurgitation is not visualized. The aortic valve is structurally normal, with no evidence of sclerosis or stenosis. Pulmonic Valve: The pulmonic valve was not well visualized. Pulmonic valve regurgitation is not visualized. Pulmonic regurgitation is not visualized. Aorta: The aortic root and ascending aorta are structurally normal, with no evidence of dilitation. Venous: The inferior vena cava is normal in size with greater than 50% respiratory variability, suggesting right atrial pressure of 3 mmHg. IAS/Shunts: The interatrial septum was not well visualized.  LEFT VENTRICLE PLAX 2D LVIDd:         4.80 cm       Diastology LVIDs:         3.20 cm       LV e' lateral:   12.70 cm/s LV PW:  1.10 cm       LV E/e' lateral: 7.1 LV IVS:        1.00 cm       LV e' medial:    9.25 cm/s LVOT diam:     1.80 cm       LV E/e' medial:  9.8 LV SV:         67 ml LV SV Index:   34.38 LVOT Area:     2.54 cm  LV Volumes (MOD) LV area d, A2C:    21.90 cm LV area d, A4C:    20.90 cm LV area s, A2C:    14.50 cm LV area s, A4C:    10.90 cm LV major d, A2C:   6.75 cm LV major d, A4C:   7.69 cm LV major s, A2C:   5.81 cm LV major s, A4C:   6.54 cm LV vol d, MOD A2C: 62.0 ml LV vol d, MOD A4C: 46.5 ml LV vol s, MOD A2C: 31.6 ml LV vol s, MOD  A4C: 15.4 ml LV SV MOD A2C:     30.4 ml LV SV MOD A4C:     46.5 ml LV SV MOD BP:      33.6 ml RIGHT VENTRICLE             IVC RV S prime:     11.20 cm/s  IVC diam: 1.80 cm TAPSE (M-mode): 2.0 cm LEFT ATRIUM             Index       RIGHT ATRIUM           Index LA diam:        2.20 cm 1.19 cm/m  RA Area:     13.40 cm LA Vol (A2C):   33.0 ml 17.85 ml/m RA Volume:   31.00 ml  16.77 ml/m LA Vol (A4C):   26.3 ml 14.22 ml/m LA Biplane Vol: 29.6 ml 16.01 ml/m  AORTIC VALVE LVOT Vmax:   121.00 cm/s LVOT Vmean:  85.700 cm/s LVOT VTI:    0.237 m  AORTA Ao Root diam: 2.70 cm Ao Asc diam:  2.70 cm MITRAL VALVE MV Area (PHT): 3.53 cm             SHUNTS MV PHT:        62.35 msec           Systemic VTI:  0.24 m MV Decel Time: 215 msec             Systemic Diam: 1.80 cm MV E velocity: 90.30 cm/s 103 cm/s MV A velocity: 70.00 cm/s 70.3 cm/s MV E/A ratio:  1.29       1.5  Oswaldo Milian MD Electronically signed by Oswaldo Milian MD Signature Date/Time: 03/29/2019/11:16:48 AM    Final      EKG: normal EKG, normal sinus rhythm.   Physical Examination: Temp:  [98.7 F (37.1 C)] 98.7 F (37.1 C) (01/05 1801) Pulse Rate:  [72-97] 78 (01/06 1237) Resp:  [12-22] 14 (01/06 1237) BP: (101-115)/(71-83) 107/74 (01/06 1237) SpO2:  [95 %-99 %] 98 % (01/06 1237) Pleasant anxious middle-aged Caucasian lady not in distress. . Afebrile. Head is nontraumatic. Neck is supple without bruit.    Cardiac exam no murmur or gallop. Lungs are clear to auscultation. Distal pulses are well felt. Neurological Exam ;  Awake  Alert oriented x 3. Normal speech and language.eye movements full without nystagmus.fundi were not visualized. Vision acuity and fields appear normal.  Hearing is normal. Palatal movements are normal. Face symmetric. Tongue midline. Mild subjective weakness of left hip flexors and extensors 4/5 and ankle dorsiflexors 4/5 but effort is poor and variable..  Subjective diminished left arm and leg touch and  pinprick sensation with preserved position and vibration sensation.. Gait appears to be antalgic with some favoring of the left hip and her back.  She is able to stand on either foot unsupported and on her toes and heels.  Unable to walk tandem.  Deep tendon reflexes are asymmetric and brisker on the left upper and lower extremity compared to the right.  Both plantars are downgoing.  Assessment:  Ms. SHERALYN PINEGAR is a 44 y.o. female with history of depression, scoliosis with chronic back pain presenting with L sided weakness and loss of sensation of the L since 1/1. She did not receive IV t-PA as she presented outside the window.  Patient has presented with 5-day history of transient slurred speech left-sided blurred vision and double vision which appears to resolve but has some persistent left-sided paresthesias and some more appear to be chronic gait difficulties and back and neck pain.  Clinical presentation is not consistent with a stroke and brain imaging is negative but other possibilities including atypical migraine or compressive spinal lesion possibilities which have not been fully evaluated.  I recommend checking MRI scan cervical and thoracic spine to rule out compressive lesion but patient is unwilling to stay because of family issues.  CT head No acute stroke. Small vessel disease. Sinus dz.   CTA head & neck Unremarkable. B pulm ground glass opacity favored to be atx   MRI  normal  MRA head & neck  normal  2D Echo EF 60-65%. No source of embolus   LDL 127  HgbA1c 5.4   Lovenox 40 mg sq daily for VTE prophylaxis  aspirin 81 mg daily prior to admission, received aspirin 81 yesterday, now on aspirin 325 mg daily.   Therapy recommendations:  OP PT  Disposition:  pending - anxious to go home  Hyperlipidemia  Home meds:  No statin  Started on Lipitor 40  LDL 127  Continue statin at discharge  Other Stroke Risk Factors  Cigarette smoker, advised to stop  smoking  ETOH use, advised to drink no more than 1 drink(s) a day  Obesity, Body mass index is 31.89 kg/m., recommend weight loss, diet and exercise as appropriate   Hospital day # 0  Patient will benefit with obtaining further imaging studies of cervical and thoracic spine to rule out compressive lesion but she is unwilling to stay and she has a family emergency.  She may follow-up electively as an outpatient in the clinic if needed.  She does follow-up in the pain clinic in in follow-up there sooner if necessary.  Discussed with Dr. Oretha Milch.  Greater than 50% time during this 60-minute consultation visit was spent on counseling and coordination of care and answering questions. Thank you for this consultation and allowing [place] to participate in the care of this patient.  Antony Contras, MD  To contact Stroke Continuity provider, please refer to [email or web address] After hours, contact General Neurology

## 2019-03-29 NOTE — Progress Notes (Signed)
  Echocardiogram 2D Echocardiogram has been performed.  Janalyn Harder 03/29/2019, 8:47 AM

## 2019-03-29 NOTE — ED Notes (Signed)
Pt explained she did not want to wait any longer for more scans and wanted to leave. Provider made aware. Risks explained to the patient. Pt discharged AMA.

## 2019-03-29 NOTE — Evaluation (Signed)
Physical Therapy Evaluation Patient Details Name: Breanna Wise MRN: [id number] DOB: Feb 04, 1976 Today's Date: 03/29/2019   History of Present Illness  Breanna Wise is a 44 y.o. female with medical history significant of depression, scoliosis with chronic back pain, no prior cardiac disease who presents to the ER with left-sided tingling and weakness for about 5 days.  MRI negative for acute changes  Clinical Impression  Patient presents with mobility limited due to decreased strength, decreased sensation L LE/UE.  Currently limited with ambulation due to pain& weakness L LE, but with HHA able to go increased distance, and with cane short distance, but most independent with walker.  Reports has walker at home.  Feel continued skilled PT in outpatient setting to maximize mobility and safety with assistive device.  Will sign off as planned d/c today.     Follow Up Recommendations Outpatient PT    Equipment Recommendations  None recommended by PT    Recommendations for Other Services       Precautions / Restrictions Precautions Precautions: Fall      Mobility  Bed Mobility Overal bed mobility: Needs Assistance Bed Mobility: Supine to Sit     Supine to sit: Modified independent (Device/Increase time)        Transfers Overall transfer level: Needs assistance Equipment used: None;Straight cane Transfers: Sit to/from Stand Sit to Stand: Min guard;Supervision         General transfer comment: increased time hand suppot needed  Ambulation/Gait     Assistive device: Pushed wheelchair;Straight cane;1 person hand held assist Gait Pattern/deviations: Step-to pattern;Step-through pattern;Decreased stance time - left;Decreased dorsiflexion - left     General Gait Details: initially with HHA then used cane, then pushed w/c to determine safest device for home. slow pace, limping on L with HHA on L assist for L lateral weight shift and cues for pushing through leg in  stance  Stairs            Wheelchair Mobility    Modified Rankin (Stroke Patients Only) Modified Rankin (Stroke Patients Only) Pre-Morbid Rankin Score: No significant disability Modified Rankin: Moderately severe disability     Balance Overall balance assessment: Needs assistance   Sitting balance-Leahy Scale: Good     Standing balance support: No upper extremity supported Standing balance-Leahy Scale: Fair Standing balance comment: static standing without UE support, but dynamic activity requires UE support                             Pertinent Vitals/Pain Pain Assessment: 0-10 Pain Score: 6  Pain Location: spine and L side Pain Descriptors / Indicators: Aching;Tingling Pain Intervention(s): Monitored during session;Repositioned    Home Living Family/patient expects to be discharged to:: Private residence Living Arrangements: Children Available Help at Discharge: Family;Available 24 hours/day Type of Home: House Home Access: Level entry(from back)     Home Layout: Two level;Able to live on main level with bedroom/bathroom Home Equipment: Walker - 2 wheels(maybe has one in closet)      Prior Function Level of Independence: Independent               Hand Dominance        Extremity/Trunk Assessment   Upper Extremity Assessment Upper Extremity Assessment: LUE deficits/detail LUE Deficits / Details: AROM WFL, strength 3+/5 LUE Sensation: decreased light touch LUE Coordination: decreased gross motor    Lower Extremity Assessment Lower Extremity Assessment: LLE deficits/detail LLE Deficits / Details: AROM WFL,  except decreased ankle DF compared to R, but PROM WFL, strength 4-/5 LLE Sensation: decreased light touch LLE Coordination: decreased gross motor       Communication   Communication: No difficulties  Cognition Arousal/Alertness: Awake/alert Behavior During Therapy: WFL for tasks assessed/performed Overall Cognitive Status:  Within Functional Limits for tasks assessed                                        General Comments General comments (skin integrity, edema, etc.): Educated in fall preventions; reports daughter can assist with safety in shower and with mobility with RW.    Exercises     Assessment/Plan    PT Assessment All further PT needs can be met in the next venue of care  PT Problem List Decreased strength;Decreased balance;Decreased knowledge of use of DME;Decreased mobility;Impaired sensation       PT Treatment Interventions      PT Goals (Current goals can be found in the Care Plan section)  Acute Rehab PT Goals PT Goal Formulation: All assessment and education complete, DC therapy    Frequency     Barriers to discharge        Co-evaluation               AM-PAC PT "6 Clicks" Mobility  Outcome Measure Help needed turning from your back to your side while in a flat bed without using bedrails?: None Help needed moving from lying on your back to sitting on the side of a flat bed without using bedrails?: None Help needed moving to and from a bed to a chair (including a wheelchair)?: A Little Help needed standing up from a chair using your arms (e.g., wheelchair or bedside chair)?: A Little Help needed to walk in hospital room?: A Little Help needed climbing 3-5 steps with a railing? : A Little 6 Click Score: 20    End of Session   Activity Tolerance: Patient tolerated treatment well Patient left: in bed;with call bell/phone within reach Nurse Communication: Other (comment)(needing d/c for appointment this pm) PT Visit Diagnosis: Other abnormalities of gait and mobility (R26.89);Other symptoms and signs involving the nervous system (R29.898)    Time: 6433-2951 PT Time Calculation (min) (ACUTE ONLY): 60 min   Charges:   PT Evaluation $PT Eval Moderate Complexity: Gilliam, PT Acute Rehabilitation  Services [phone] 03/29/2019   Reginia Naas 03/29/2019, 2:04 PM

## 2020-08-04 ENCOUNTER — Encounter (HOSPITAL_BASED_OUTPATIENT_CLINIC_OR_DEPARTMENT_OTHER): Payer: Self-pay | Admitting: Emergency Medicine

## 2020-08-04 ENCOUNTER — Emergency Department (HOSPITAL_BASED_OUTPATIENT_CLINIC_OR_DEPARTMENT_OTHER)
Admission: EM | Admit: 2020-08-04 | Discharge: 2020-08-04 | Disposition: A | Discharge status: Home / Self Care | Payer: Medicare Other | Attending: Emergency Medicine | Admitting: Emergency Medicine

## 2020-08-04 ENCOUNTER — Emergency Department (HOSPITAL_BASED_OUTPATIENT_CLINIC_OR_DEPARTMENT_OTHER): Payer: Medicare Other

## 2020-08-04 ENCOUNTER — Other Ambulatory Visit: Payer: Self-pay

## 2020-08-04 DIAGNOSIS — M546 Pain in thoracic spine: Secondary | ICD-10-CM | POA: Diagnosis present

## 2020-08-04 DIAGNOSIS — F17200 Nicotine dependence, unspecified, uncomplicated: Secondary | ICD-10-CM | POA: Diagnosis not present

## 2020-08-04 DIAGNOSIS — M549 Dorsalgia, unspecified: Secondary | ICD-10-CM

## 2020-08-04 DIAGNOSIS — Z7982 Long term (current) use of aspirin: Secondary | ICD-10-CM | POA: Insufficient documentation

## 2020-08-04 IMAGING — DX DG THORACIC SPINE 3V
3 series · 3 of 3 positions shown · non-contrast
Comparison: Thoracic spine radiographs [DATE]

CLINICAL DATA: Mid back pain after bending over a couple weeks ago

EXAM:
THORACIC SPINE - 3 VIEWS

[t-spine ap]
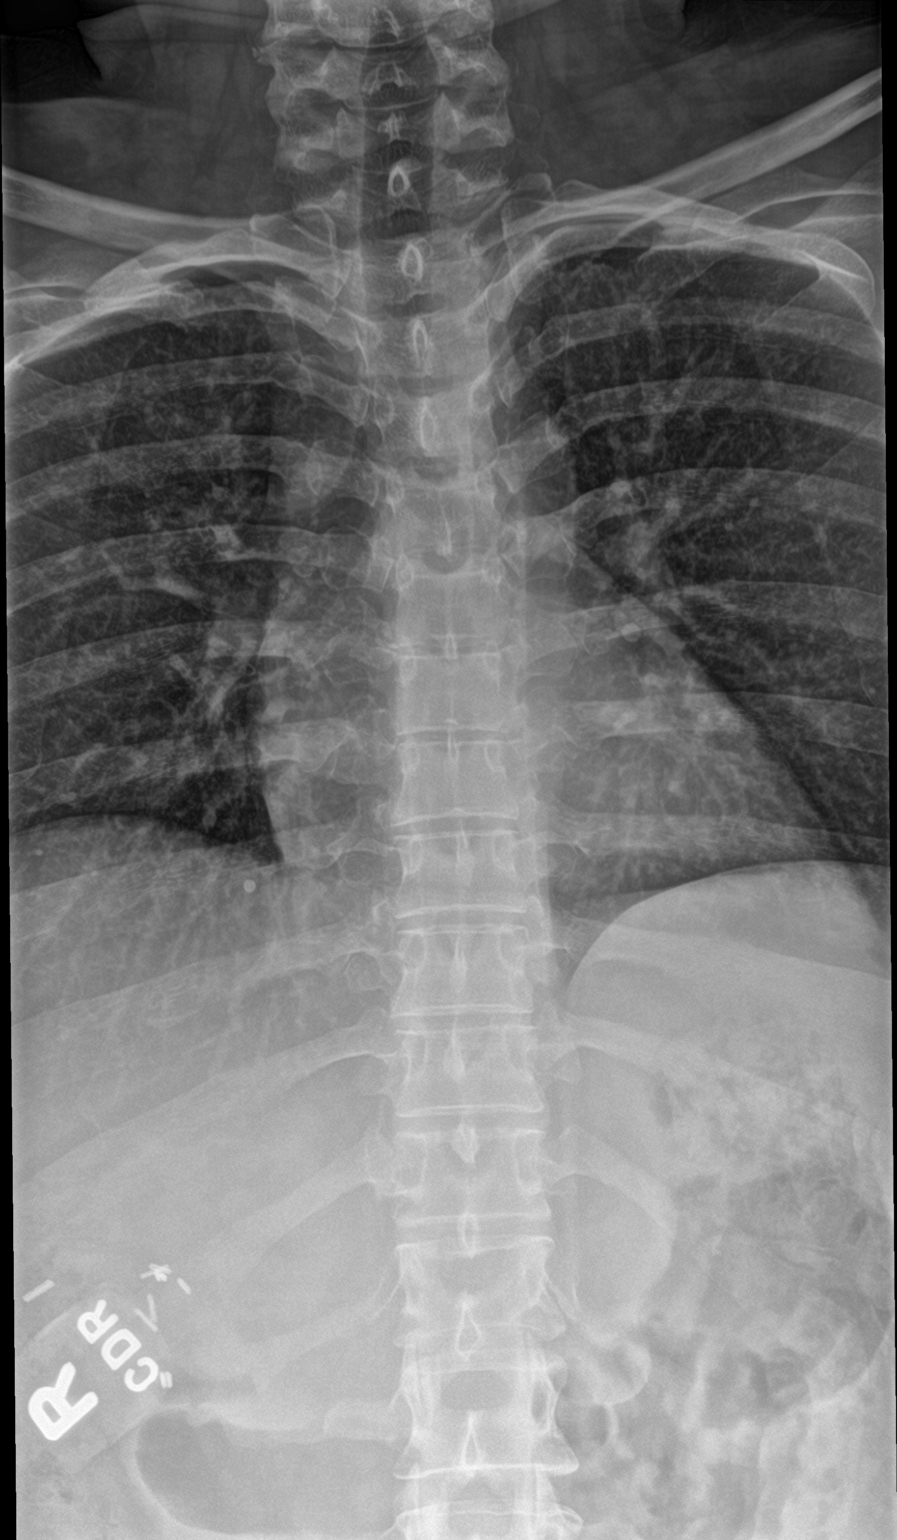

[t-spine lat]
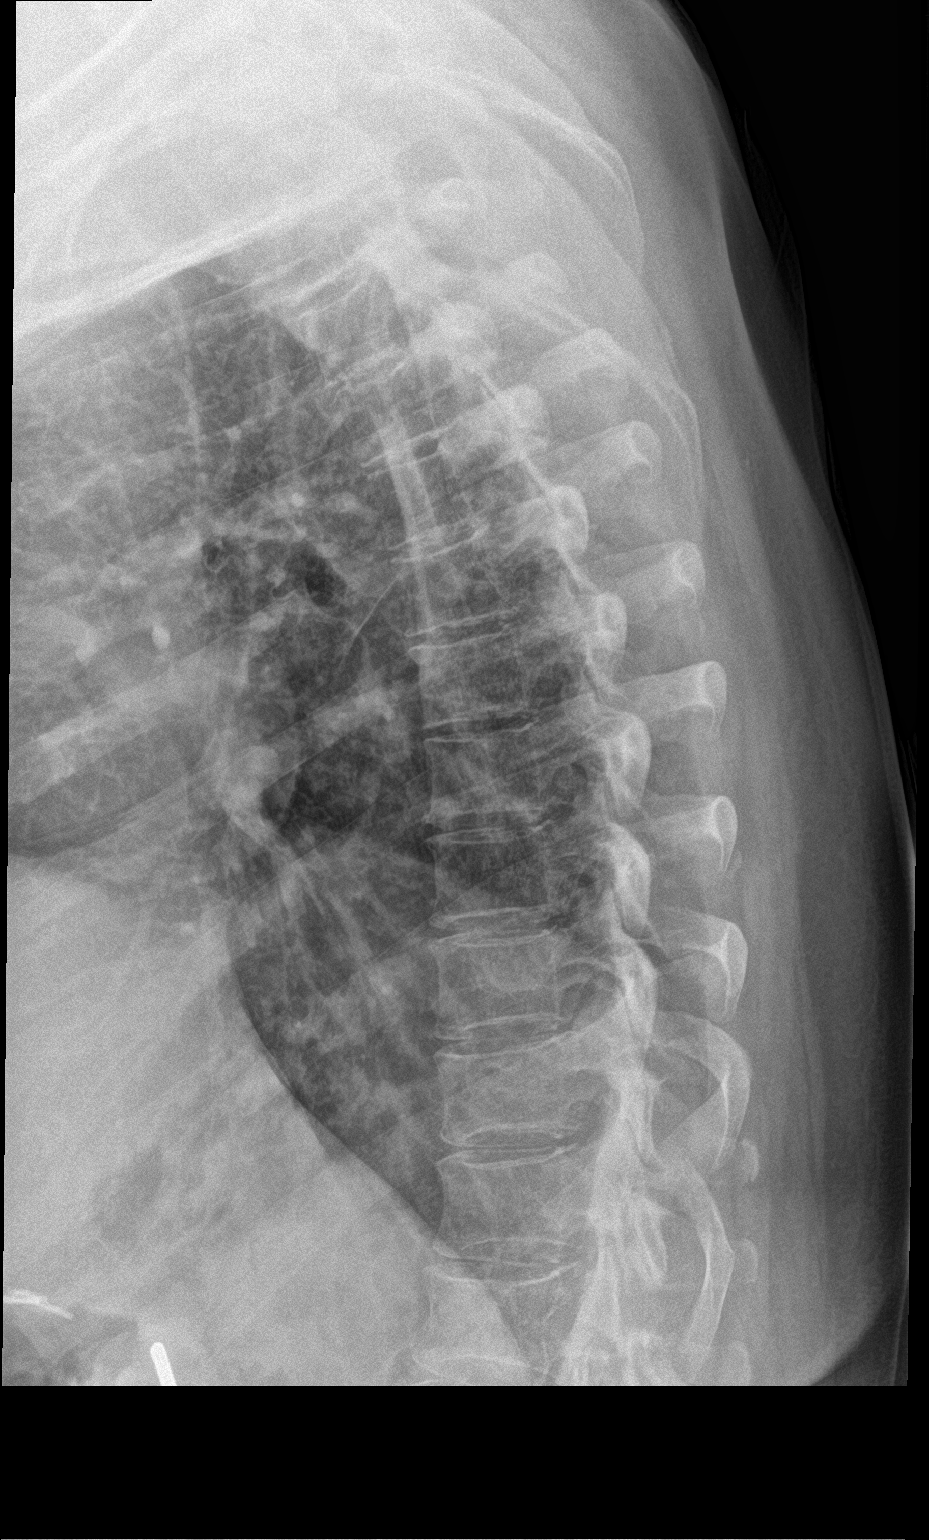

[t-spine swimmers]
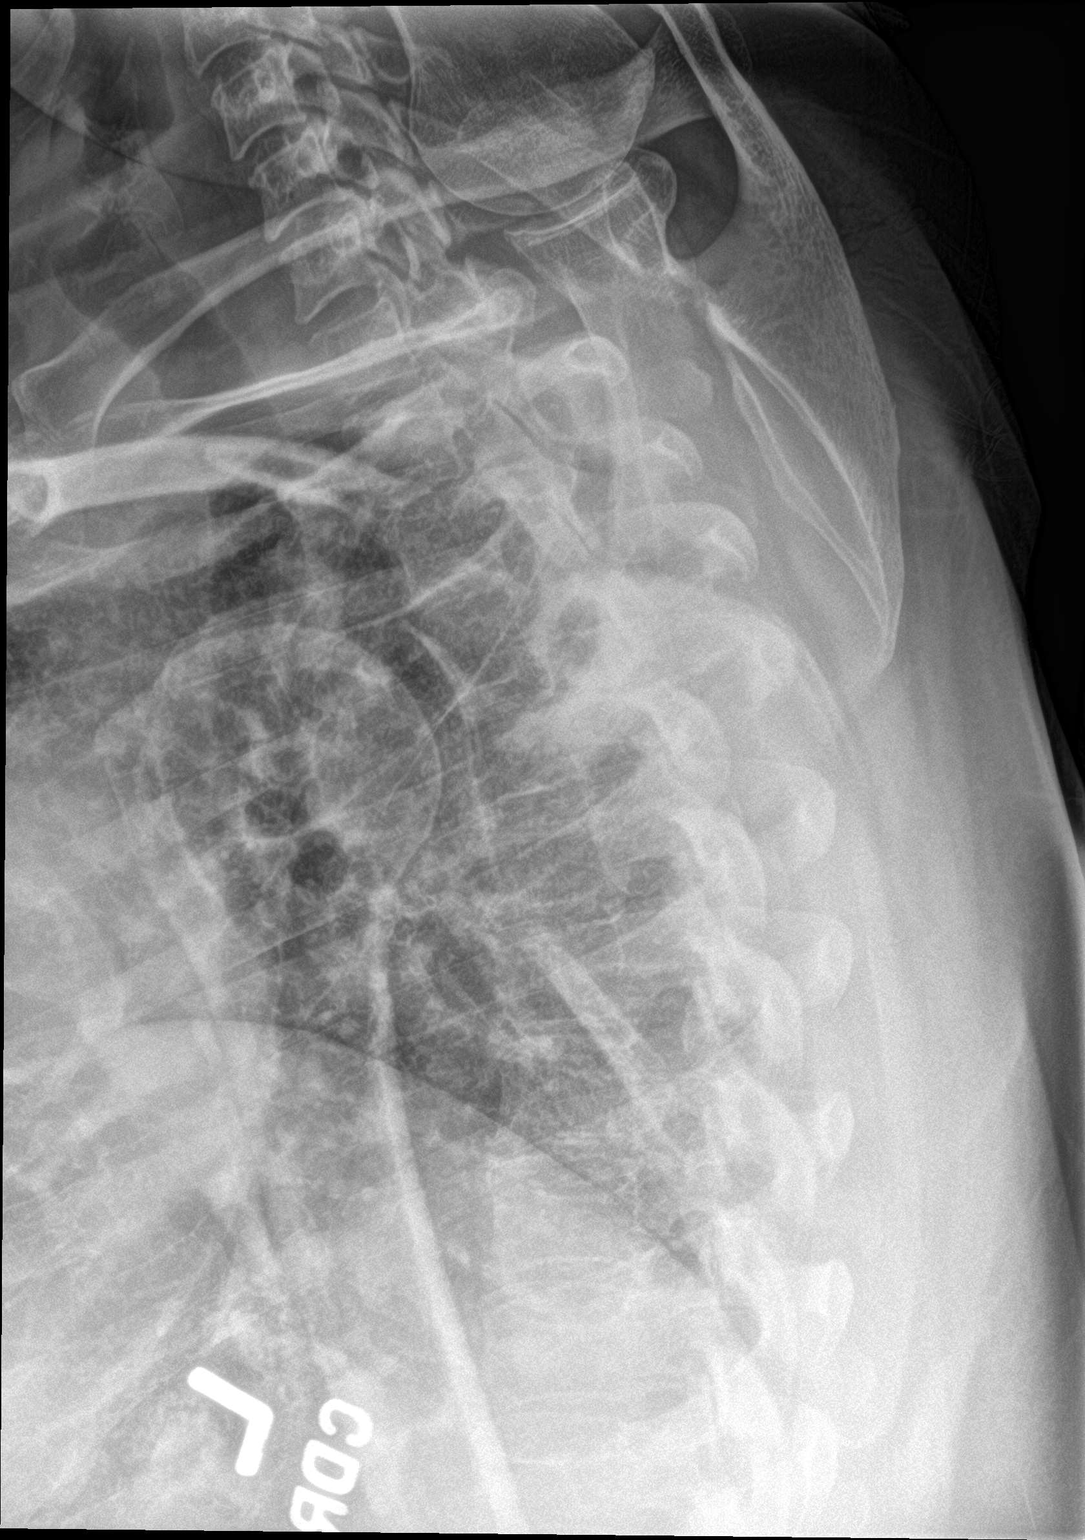

[3 of 3 positions shown; findings below may reference images not displayed]

FINDINGS: There is no evidence of thoracic spine fracture. Alignment is
normal. No other significant bone abnormalities are identified.
IMPRESSION: Negative lumbar spine radiographs.

## 2020-08-04 MED ORDER — OXYCODONE HCL 5 MG PO TABS
15.0000 mg | ORAL_TABLET | Freq: Once | ORAL | Status: AC
  Administered 2020-08-04: 15 mg via ORAL
  Filled 2020-08-04: qty 3

## 2020-08-04 NOTE — Discharge Instructions (Addendum)
Please read instructions below.  Take your home medications as directed.  Follow-up with your back doctor. Follow-up with your primary care provider symptoms persist.   Return to ER if new numbness or tingling in your arms or legs, inability to urinate, inability to hold your bowels, or weakness in your extremities.

## 2020-08-04 NOTE — ED Triage Notes (Signed)
Pt c/o mid back pain after bending over to pick up her 40 lb grandchild couple weeks ago. Pt also reports pain to left leg and is concerned she may have another slip disc

## 2020-08-04 NOTE — ED Provider Notes (Signed)
[facility] EMERGENCY DEPARTMENT Provider Note   CSN: [id number] Arrival date & time: 08/04/20  1316     History Chief Complaint  Patient presents with  . Back Pain    Breanna Wise is a 45 y.o. female with chronic back pain, scoliosis, presenting with complaint of thoracic back pain. Patient states about 4 weeks ago she bent down to pick up her grandson and when she went to lift him she immediately felt a sudden pain between her shoulder blades in her back.  She has been having back pain here since.  She is followed by back doctor, is also in pain management.  Takes 10 mg of oxycodone 3 times daily, as well as 15 mg of morphine ER.  States her pain medication has not controlling her symptoms.  She has chronic left weakness after a stroke.  Also has intermittent numbness which is not unusual considering her chronic lumbar back pain.  No new incontinence, fever, or other neuro deficits.  States she is mostly here for an x-ray.  The history is provided by the patient.       Past Medical History:  Diagnosis Date  . Back pain   . Depression   . Scoliosis     Patient Active Problem List   Diagnosis Date Noted  . TIA (transient ischemic attack) 03/28/2019    Past Surgical History:  Procedure Laterality Date  . BACK SURGERY    . CHOLECYSTECTOMY       OB History   No obstetric history on file.     No family history on file.  Social History   Tobacco Use  . Smoking status: Current Every Day Smoker  . Smokeless tobacco: Never Used  Vaping Use  . Vaping Use: Never used  Substance Use Topics  . Alcohol use: Not Currently  . Drug use: No    Home Medications Prior to Admission medications   Medication Sig Start Date End Date Taking? Authorizing Provider  aspirin 81 MG EC tablet Take 162 mg by mouth daily.    [provider]  baclofen (LIORESAL) 10 MG tablet Take 10 mg by mouth 2 (two) times daily. 03/01/19   [provider]  busPIRone  (BUSPAR) 15 MG tablet Take 15 mg by mouth 3 (three) times daily. 03/25/19   [provider]  clonazePAM (KLONOPIN) 0.5 MG tablet Take 0.5 mg by mouth 2 (two) times daily as needed for anxiety.    [provider]  LINZESS 72 MCG capsule Take 72 mcg by mouth daily as needed. 03/01/19   [provider]  methocarbamol (ROBAXIN) 500 MG tablet Take 1 tablet (500 mg total) by mouth 2 (two) times daily. 02/14/17   Hedges, Dellis Filbert, PA-C  metoCLOPramide (REGLAN) 5 MG tablet Take 10 mg by mouth daily. 03/01/19   [provider]  mirtazapine (REMERON) 15 MG tablet Take 7.5-15 mg by mouth at bedtime. 01/05/19   [provider]  NARCAN 4 MG/0.1ML LIQD nasal spray kit Place 1 spray into the nose once. Repeat in 3 minutes if no response; call EMS immediately. 01/05/19   [provider]  ondansetron (ZOFRAN) 8 MG tablet Take 8 mg by mouth every 8 (eight) hours as needed for nausea/vomiting. 03/01/19   [provider]  oxyCODONE-acetaminophen (PERCOCET) 10-325 MG tablet Take 1 tablet by mouth 4 (four) times daily. 03/01/19   [provider]  predniSONE (DELTASONE) 5 MG tablet Take 5 mg by mouth 2 (two) times daily as needed (  hip pain).  03/01/19   [provider]  pregabalin (LYRICA) 100 MG capsule Take 100 mg by mouth 3 (three) times daily. 03/01/19   [provider]  promethazine (PHENERGAN) 25 MG tablet Take 25 mg by mouth every 12 (twelve) hours as needed for nausea/vomiting. 01/11/19   [provider]  traZODone (DESYREL) 150 MG tablet Take 300 mg by mouth at bedtime. 02/23/19   [provider]  Vitamin D, Ergocalciferol, (DRISDOL) 1.25 MG (50000 UT) CAPS capsule Take 50,000 Units by mouth once a week. Fridays 03/01/19   [provider]    Allergies    Amoxicillin, Fluoxetine, Methocarbamol, Penicillins, and Rocephin [ceftriaxone sodium in dextrose]  Review of Systems   Review of Systems   Musculoskeletal: Positive for back pain.  All other systems reviewed and are negative.   Physical Exam Updated Vital Signs BP 111/77 (BP Location: Right Arm)   Pulse 86   Temp 98.2 F (36.8 C) (Oral)   Resp 18   Ht _0  (1.6 m)   Wt 88.9 kg   LMP 07/14/2020   SpO2 98%   BMI 34.72 kg/m   Physical Exam Vitals and nursing note reviewed.  Constitutional:      General: She is not in acute distress.    Appearance: She is well-developed.  HENT:     Head: Normocephalic and atraumatic.  Eyes:     Conjunctiva/sclera: Conjunctivae normal.  Cardiovascular:     Rate and Rhythm: Normal rate and regular rhythm.  Pulmonary:     Effort: Pulmonary effort is normal. No respiratory distress.     Breath sounds: Normal breath sounds.  Abdominal:     General: Bowel sounds are normal.     Tenderness: There is no abdominal tenderness.  Musculoskeletal:     Comments: Generalized tenderness to the mid T-spine and paraspinal musculature.  No bony step-offs or gross deformities.   Neurological:     Mental Status: She is alert.     Comments: Equal grip strength bilateral upper extremities.  4/5 strength to left lower extremity with dorsi and plantar flexion, 5/5 strength right lower extremity.  (Baseline per patient with history of stroke). Normal tone.  Sensory:light touch normal in BLE extremities.   2+ patellar reflexes b/l Gait: normal gait and balance CV: distal pulses palpable throughout    Psychiatric:        Mood and Affect: Mood normal.        Behavior: Behavior normal.     ED Results / Procedures / Treatments   Labs (all labs ordered are listed, but only abnormal results are displayed) Labs Reviewed - No data to display  EKG None  Radiology DG Thoracic Spine W/Swimmers  Result Date: 08/04/2020 CLINICAL DATA:  Mid back pain after bending over a couple weeks ago EXAM: THORACIC SPINE - 3 VIEWS COMPARISON:  Thoracic spine radiographs 05/09/2020 FINDINGS: There is no evidence  of thoracic spine fracture. Alignment is normal. No other significant bone abnormalities are identified. IMPRESSION: Negative lumbar spine radiographs. Electronically Signed   By: Audie Pinto M.D.   On: 08/04/2020 17:51    Procedures Procedures   Medications Ordered in ED Medications  oxyCODONE (Oxy IR/ROXICODONE) immediate release tablet 15 mg (15 mg Oral Given 08/04/20 1646)    ED Course  I have reviewed the triage vital signs and the nursing notes.  Pertinent labs & imaging results that were available during my care of the patient were reviewed by me and considered in my medical  decision making (see chart for details).    MDM Rules/Calculators/A&P                          Patient presenting for thoracic back pain that began after she bent down to pick up her grandson 4 weeks ago.  Has longstanding history of chronic lumbar back pain, is on high doses of morphine and oxycodone daily.  Is followed by spine as well.  States she is here for x-ray.  No new focal neurodeficits.  Plain film is negative.  Follow-up CT scan, however patient states she is ready to go home and will follow with her spine doctor.  Believe this is reasonable, no new deficits.  No obvious new vertebral body fracture on plain film.  Patient discharged in no distress with steady gait.  Discussed results, findings, treatment and follow up. Patient advised of return precautions. Patient verbalized understanding and agreed with plan.  Final Clinical Impression(s) / ED Diagnoses Final diagnoses:  Back pain    Rx / DC Orders ED Discharge Orders         Ordered    CT THORACIC SPINE WO CONTRAST  Status:  Canceled        08/04/20 [place], [place] N, PA-C 08/04/20 1836    Malvin Johns, MD 08/04/20 2318

## 2020-08-04 NOTE — ED Notes (Signed)
ED Provider at bedside. 

## 2020-10-07 DIAGNOSIS — R079 Chest pain, unspecified: Secondary | ICD-10-CM

## 2020-10-07 DIAGNOSIS — E785 Hyperlipidemia, unspecified: Secondary | ICD-10-CM | POA: Diagnosis not present

## 2020-10-07 DIAGNOSIS — F17210 Nicotine dependence, cigarettes, uncomplicated: Secondary | ICD-10-CM | POA: Diagnosis not present

## 2020-10-08 ENCOUNTER — Encounter (HOSPITAL_COMMUNITY): Payer: Self-pay | Admitting: Interventional Cardiology

## 2020-10-08 ENCOUNTER — Ambulatory Visit (HOSPITAL_COMMUNITY)
Admission: AD | Admit: 2020-10-08 | Discharge: 2020-10-08 | Disposition: A | Discharge status: Home / Self Care | Payer: Medicare Other | Source: Other Acute Inpatient Hospital | Attending: Interventional Cardiology | Admitting: Interventional Cardiology

## 2020-10-08 ENCOUNTER — Encounter (HOSPITAL_COMMUNITY)
Admission: AD | Disposition: A | Discharge status: Home / Self Care | Payer: Self-pay | Source: Other Acute Inpatient Hospital | Attending: Interventional Cardiology

## 2020-10-08 DIAGNOSIS — Z881 Allergy status to other antibiotic agents status: Secondary | ICD-10-CM | POA: Insufficient documentation

## 2020-10-08 DIAGNOSIS — R072 Precordial pain: Secondary | ICD-10-CM

## 2020-10-08 DIAGNOSIS — Z79899 Other long term (current) drug therapy: Secondary | ICD-10-CM | POA: Diagnosis not present

## 2020-10-08 DIAGNOSIS — M545 Low back pain, unspecified: Secondary | ICD-10-CM | POA: Insufficient documentation

## 2020-10-08 DIAGNOSIS — Z7982 Long term (current) use of aspirin: Secondary | ICD-10-CM | POA: Insufficient documentation

## 2020-10-08 DIAGNOSIS — Z880 Allergy status to penicillin: Secondary | ICD-10-CM | POA: Diagnosis not present

## 2020-10-08 DIAGNOSIS — Z888 Allergy status to other drugs, medicaments and biological substances status: Secondary | ICD-10-CM | POA: Insufficient documentation

## 2020-10-08 DIAGNOSIS — F419 Anxiety disorder, unspecified: Secondary | ICD-10-CM | POA: Insufficient documentation

## 2020-10-08 DIAGNOSIS — E785 Hyperlipidemia, unspecified: Secondary | ICD-10-CM | POA: Diagnosis not present

## 2020-10-08 DIAGNOSIS — R079 Chest pain, unspecified: Secondary | ICD-10-CM | POA: Diagnosis not present

## 2020-10-08 DIAGNOSIS — F17210 Nicotine dependence, cigarettes, uncomplicated: Secondary | ICD-10-CM | POA: Insufficient documentation

## 2020-10-08 HISTORY — PX: LEFT HEART CATH AND CORONARY ANGIOGRAPHY: CATH118249

## 2020-10-08 SURGERY — LEFT HEART CATH AND CORONARY ANGIOGRAPHY
Anesthesia: LOCAL

## 2020-10-08 MED ORDER — FENTANYL CITRATE (PF) 100 MCG/2ML IJ SOLN
INTRAMUSCULAR | Status: AC
  Filled 2020-10-08: qty 2

## 2020-10-08 MED ORDER — HYDRALAZINE HCL 20 MG/ML IJ SOLN: 10.0000 mg | INTRAMUSCULAR | Status: DC | PRN

## 2020-10-08 MED ORDER — LIDOCAINE HCL (PF) 1 % IJ SOLN
INTRAMUSCULAR | Status: AC
  Filled 2020-10-08: qty 30

## 2020-10-08 MED ORDER — TRAZODONE HCL 50 MG PO TABS: 200.0000 mg | ORAL_TABLET | Freq: Every day | ORAL | Status: AC

## 2020-10-08 MED ORDER — ONDANSETRON HCL 4 MG/2ML IJ SOLN: 4.0000 mg | Freq: Four times a day (QID) | INTRAMUSCULAR | Status: DC | PRN

## 2020-10-08 MED ORDER — VERAPAMIL HCL 2.5 MG/ML IV SOLN
INTRAVENOUS | Status: AC
  Filled 2020-10-08: qty 2

## 2020-10-08 MED ORDER — HEPARIN SODIUM (PORCINE) 1000 UNIT/ML IJ SOLN
INTRAMUSCULAR | Status: AC
  Filled 2020-10-08: qty 1

## 2020-10-08 MED ORDER — MIDAZOLAM HCL 2 MG/2ML IJ SOLN
INTRAMUSCULAR | Status: DC | PRN
  Administered 2020-10-08 (×3): 1 mg via INTRAVENOUS
  Administered 2020-10-08: 2 mg via INTRAVENOUS

## 2020-10-08 MED ORDER — ACETAMINOPHEN 325 MG PO TABS: 650.0000 mg | ORAL_TABLET | ORAL | Status: DC | PRN

## 2020-10-08 MED ORDER — HEPARIN (PORCINE) IN NACL 1000-0.9 UT/500ML-% IV SOLN
INTRAVENOUS | Status: DC | PRN
  Administered 2020-10-08 (×3): 500 mL

## 2020-10-08 MED ORDER — MIDAZOLAM HCL 2 MG/2ML IJ SOLN
INTRAMUSCULAR | Status: AC
  Filled 2020-10-08: qty 2

## 2020-10-08 MED ORDER — SODIUM CHLORIDE 0.9 % IV SOLN: 250.0000 mL | INTRAVENOUS | Status: DC | PRN

## 2020-10-08 MED ORDER — LIDOCAINE HCL (PF) 1 % IJ SOLN
INTRAMUSCULAR | Status: DC | PRN
  Administered 2020-10-08: 2 mL

## 2020-10-08 MED ORDER — HEPARIN SODIUM (PORCINE) 1000 UNIT/ML IJ SOLN
INTRAMUSCULAR | Status: DC | PRN
  Administered 2020-10-08: 4500 [IU] via INTRAVENOUS

## 2020-10-08 MED ORDER — VERAPAMIL HCL 2.5 MG/ML IV SOLN
INTRAVENOUS | Status: DC | PRN
  Administered 2020-10-08: 10 mL via INTRA_ARTERIAL

## 2020-10-08 MED ORDER — LABETALOL HCL 5 MG/ML IV SOLN: 10.0000 mg | INTRAVENOUS | Status: DC | PRN

## 2020-10-08 MED ORDER — FENTANYL CITRATE (PF) 100 MCG/2ML IJ SOLN
INTRAMUSCULAR | Status: DC | PRN
  Administered 2020-10-08 (×5): 25 ug via INTRAVENOUS

## 2020-10-08 MED ORDER — HEPARIN (PORCINE) IN NACL 1000-0.9 UT/500ML-% IV SOLN
INTRAVENOUS | Status: AC
  Filled 2020-10-08: qty 1000

## 2020-10-08 MED ORDER — SODIUM CHLORIDE 0.9% FLUSH: 3.0000 mL | Freq: Two times a day (BID) | INTRAVENOUS | Status: DC

## 2020-10-08 MED ORDER — VERAPAMIL HCL 2.5 MG/ML IV SOLN
INTRAVENOUS | Status: DC | PRN
Start: 2020-10-08 — End: 2020-10-08
  Administered 2020-10-08: 2 mg via INTRA_ARTERIAL

## 2020-10-08 MED ORDER — SODIUM CHLORIDE 0.9 % IV SOLN: INTRAVENOUS | Status: AC

## 2020-10-08 MED ORDER — IOHEXOL 350 MG/ML SOLN
INTRAVENOUS | Status: DC | PRN
  Administered 2020-10-08: 85 mL via INTRA_ARTERIAL

## 2020-10-08 MED ORDER — SODIUM CHLORIDE 0.9% FLUSH: 3.0000 mL | INTRAVENOUS | Status: DC | PRN

## 2020-10-08 SURGICAL SUPPLY — 12 items
CATH 5FR JL3.5 JR4 ANG PIG MP (CATHETERS) ×2 IMPLANT
CATH INFINITI 5 FR 3DRC (CATHETERS) ×2 IMPLANT
CATH INFINITI 5FR AL1 (CATHETERS) ×2 IMPLANT
DEVICE RAD COMP TR BAND LRG (VASCULAR PRODUCTS) ×2 IMPLANT
GLIDESHEATH SLEND SS 6F .021 (SHEATH) ×2 IMPLANT
GUIDEWIRE INQWIRE 1.5J.035X260 (WIRE) ×1 IMPLANT
INQWIRE 1.5J .035X260CM (WIRE) ×2
KIT HEART LEFT (KITS) ×2 IMPLANT
PACK CARDIAC CATHETERIZATION (CUSTOM PROCEDURE TRAY) ×2 IMPLANT
SHEATH PROBE COVER 6X72 (BAG) ×2 IMPLANT
TRANSDUCER W/STOPCOCK (MISCELLANEOUS) ×2 IMPLANT
TUBING CIL FLEX 10 FLL-RA (TUBING) ×2 IMPLANT

## 2020-10-08 NOTE — Progress Notes (Signed)
Received patient from [facility] via [facility], alert and oriented X4, skin warm and dry, resp even and unlabored.  IV in left arm, consent signed, and patient denis any discomfort at this time.  Pt to room for cath procedure.

## 2020-10-08 NOTE — Progress Notes (Signed)
TR BAND REMOVAL  LOCATION:    right radial  DEFLATED PER PROTOCOL:    Yes.    TIME BAND OFF / DRESSING APPLIED:    1630   SITE UPON ARRIVAL:    Level 0  SITE AFTER BAND REMOVAL:    Level 0  CIRCULATION SENSATION AND MOVEMENT:    Within Normal Limits   Yes.    COMMENTS:    

## 2020-10-08 NOTE — Discharge Summary (Addendum)
Discharge Summary    Patient ID: SWAYZIE CHOATE MRN: [id number]; DOB: November 04, 1975  Admit date: 10/08/2020 Discharge date: 10/08/2020  PCP:  Virgel Paling, MD   [facility] Providers Cardiologist:  None   {  Discharge Diagnoses    Active Problems:   Precordial chest pain    Diagnostic Studies/Procedures    Cath: 10/08/20    The left ventricular systolic function is normal.   LV end diastolic pressure is normal.   The left ventricular ejection fraction is 55-65% by visual estimate.   There is no aortic valve stenosis.   No angiographically apparent CAD.   Continue preventive therapy.   Severe tortuosity of right subclavian.  If cath was needed in the future, would either need destination sheath or femoral approach.  She also had significant radial vasospasm treated with extra verapamil and external heating pack on forearm.     _____________   History of Present Illness     Breanna Wise is a 45 y.o. female with chronic low back pain, anxiety and tobacco use who presented to [facility] on 7/17 with reports of intermittent pleuritic type chest pain originating in the left chest and radiating to her shoulder and jaw.  Was previously seen at hospital in [place] with recommendations to undergo stress test but she became anxious and left AMA.  On the day of admission to [facility] reported that she developed pain around 9 AM while ambulating in her home.  She took 2 aspirin which seemed to ease the pain.  Then developed recurrent episode later that morning around 1130.  This was present with associated shortness of breath and diaphoresis with nausea.  Presented to the ED for evaluation.  She was seen by Dr. Bettina Gavia who recommended that she undergo stress testing.  She did undergo breast imaging.  Dr. Bettina Gavia reviewed her notes in epic from [facility] hospital in [place].  Her high-sensitivity troponin at that visit was noted to be 34.  Given this finding her  stress images were canceled. Recommendations were given to transfer to [facility] for further evaluation with cardiac catheterization.  Of note troponin I was cycled and negative x3 at [facility].  Hospital Course     Underwent cardiac cath noted above with no coronary artery disease. Normal LV function of 55-65% on LV gram. No aortic valve stenosis. Severe tortuosity of the right subclavian with recommendations for femoral approach if needed. No complications noted post cath. She was sent to short stay and discharge home. Continued on home medications, updated to current list prior to discharge.   Did the patient have an acute coronary syndrome (MI, NSTEMI, STEMI, etc) this admission?:  No                               Did the patient have a percutaneous coronary intervention (stent / angioplasty)?:  No.       _____________  Discharge Vitals Blood pressure 111/83, pulse 71, resp. rate 18, SpO2 94 %.  There were no vitals filed for this visit.  Labs & Radiologic Studies    CBC No results for input(s): WBC, NEUTROABS, HGB, HCT, MCV, PLT in the last 72 hours. Basic Metabolic Panel No results for input(s): NA, K, CL, CO2, GLUCOSE, BUN, CREATININE, CALCIUM, MG, PHOS in the last 72 hours. Liver Function Tests No results for input(s): AST, ALT, ALKPHOS, BILITOT, PROT, ALBUMIN in the last 72 hours. No results for input(s): LIPASE,  AMYLASE in the last 72 hours. High Sensitivity Troponin:   No results for input(s): TROPONINIHS in the last 720 hours.  BNP Invalid input(s): POCBNP D-Dimer No results for input(s): DDIMER in the last 72 hours. Hemoglobin A1C No results for input(s): HGBA1C in the last 72 hours. Fasting Lipid Panel No results for input(s): CHOL, HDL, LDLCALC, TRIG, CHOLHDL, LDLDIRECT in the last 72 hours. Thyroid Function Tests No results for input(s): TSH, T4TOTAL, T3FREE, THYROIDAB in the last 72 hours.  Invalid input(s): FREET3 _____________  CARDIAC CATHETERIZATION  Addendum  Date: 10/08/2020     The left ventricular systolic function is normal.   LV end diastolic pressure is normal.   The left ventricular ejection fraction is 55-65% by visual estimate.   There is no aortic valve stenosis.   No angiographically apparent CAD. Continue preventive therapy. Severe tortuosity of right subclavian.  If cath was needed in the future, would either need destination sheath or femoral approach.  She also had significant radial vasospasm treated with extra verapamil and external heating pack on forearm.    Result Date: 10/08/2020 Formatting of this result is different from the original.   The left ventricular systolic function is normal.   LV end diastolic pressure is normal.   The left ventricular ejection fraction is 55-65% by visual estimate.   There is no aortic valve stenosis.   No angiographically apparent CAD. Continue preventive therapy. Severe tortuosity of right subclavian.  If cath was needed in the future, would either need destination sheath or femoral approach.  She also had significant radial vasospasm treated with extra verapamil and external heating pack on forearm.    Disposition   Pt is being discharged home today in good condition.  Follow-up Plans & Appointments     Follow-up Information     Virgel Paling, MD Follow up.   Specialty: Internal Medicine Why: Please arrange for follow up with your PCP regarding ongoing management of your cholesterol levels.                 Discharge Medications   Allergies as of 10/08/2020       Reactions   Amoxicillin Anaphylaxis   Fluoxetine Hives   Methocarbamol Shortness Of Breath   Penicillins Anaphylaxis, Swelling   Rocephin [ceftriaxone Sodium In Dextrose] Swelling        Medication List     STOP taking these medications    baclofen 10 MG tablet Commonly known as: LIORESAL   busPIRone 15 MG tablet Commonly known as: BUSPAR   methocarbamol 500 MG tablet Commonly known as: ROBAXIN    mirtazapine 15 MG tablet Commonly known as: REMERON   predniSONE 5 MG tablet Commonly known as: DELTASONE       TAKE these medications    aspirin 325 MG tablet Take 325 mg by mouth daily.   clonazePAM 0.5 MG tablet Commonly known as: KLONOPIN Take 0.5 mg by mouth 2 (two) times daily as needed for anxiety.   Linzess 72 MCG capsule Generic drug: linaclotide Take 72 mcg by mouth daily as needed.   metoCLOPramide 5 MG tablet Commonly known as: REGLAN Take 10 mg by mouth daily.   Narcan 4 MG/0.1ML Liqd nasal spray kit Generic drug: naloxone Place 1 spray into the nose once. Repeat in 3 minutes if no response; call EMS immediately.   ondansetron 8 MG tablet Commonly known as: ZOFRAN Take 8 mg by mouth every 8 (eight) hours as needed for nausea/vomiting.   oxyCODONE-acetaminophen 10-325 MG tablet  Commonly known as: PERCOCET Take 1 tablet by mouth 4 (four) times daily.   pregabalin 100 MG capsule Commonly known as: LYRICA Take 100 mg by mouth 3 (three) times daily.   promethazine 25 MG tablet Commonly known as: PHENERGAN Take 25 mg by mouth every 12 (twelve) hours as needed for nausea/vomiting.   traZODone 50 MG tablet Commonly known as: DESYREL Take 4 tablets (200 mg total) by mouth at bedtime. What changed:  medication strength how much to take   Vitamin D (Ergocalciferol) 1.25 MG (50000 UNIT) Caps capsule Commonly known as: DRISDOL Take 50,000 Units by mouth once a week. Fridays         Outstanding Labs/Studies   N/a   Duration of Discharge Encounter   Greater than 30 minutes including physician time.  Signed, Reino Bellis, NP 10/08/2020, 3:56 PM   I have examined the patient and reviewed assessment and plan and discussed with patient.  Agree with above as stated.    No CAD by cath.  Noncardiac chest pain.  Continue preventive therapy.   Larae Grooms

## 2020-10-08 NOTE — H&P (Addendum)
Cardiology Admission History and Physical:   Patient ID: HORACE WISHON MRN: [id number]; DOB: 30-Apr-1975   Admission date: 10/08/2020  PCP:  Virgel Paling, MD   [facility] Providers Cardiologist:  None   {  Chief Complaint:  Chest pain  Patient Profile:   Breanna Wise is a 45 y.o. female with chronic low back pain, anxiety and tobacco use who is being seen 10/08/2020 for the evaluation of chest pain.  History of Present Illness:   Breanna Wise is a 45 year old female with past medical history noted above.  Presented to [facility] on 7/17 with reports of intermittent pleuritic type chest pain originating in the left chest and radiating to her shoulder and jaw.  Was seen at hospital in [place] with recommendations to undergo stress test but she became anxious and left AMA.  On the day of admission to [facility] reported that she developed pain around 9 AM while ambulating in her home.  She took 2 aspirin which seemed to ease the pain.  Then developed recurrent episode later that morning around 1130.  This was present with associated shortness of breath and diaphoresis with nausea.  Presented to the ED for evaluation.  She was seen by Dr. Bettina Gavia who recommended that she undergo stress testing.  She did undergo breast imaging.  Dr. Bettina Gavia reviewed her notes in epic from [facility] hospital in [place].  Her high-sensitivity troponin at that visit was noted to be 34.  Given this finding her stress images were canceled. Recommendations were given to transfer to [facility] for further evaluation with cardiac catheterization.  Of note troponin I was cycled and negative x3 at [facility].   Past Medical History:  Diagnosis Date   Back pain    Depression    Scoliosis     Past Surgical History:  Procedure Laterality Date   BACK SURGERY     CHOLECYSTECTOMY       Medications Prior to Admission: Prior to Admission medications   Medication Sig Start Date End Date Taking?  Authorizing Provider  aspirin 81 MG EC tablet Take 162 mg by mouth daily.    [provider]  baclofen (LIORESAL) 10 MG tablet Take 10 mg by mouth 2 (two) times daily. 03/01/19   [provider]  busPIRone (BUSPAR) 15 MG tablet Take 15 mg by mouth 3 (three) times daily. 03/25/19   [provider]  clonazePAM (KLONOPIN) 0.5 MG tablet Take 0.5 mg by mouth 2 (two) times daily as needed for anxiety.    [provider]  LINZESS 72 MCG capsule Take 72 mcg by mouth daily as needed. 03/01/19   [provider]  methocarbamol (ROBAXIN) 500 MG tablet Take 1 tablet (500 mg total) by mouth 2 (two) times daily. 02/14/17   Hedges, Dellis Filbert, PA-C  metoCLOPramide (REGLAN) 5 MG tablet Take 10 mg by mouth daily. 03/01/19   [provider]  mirtazapine (REMERON) 15 MG tablet Take 7.5-15 mg by mouth at bedtime. 01/05/19   [provider]  NARCAN 4 MG/0.1ML LIQD nasal spray kit Place 1 spray into the nose once. Repeat in 3 minutes if no response; call EMS immediately. 01/05/19   [provider]  ondansetron (ZOFRAN) 8 MG tablet Take 8 mg by mouth every 8 (eight) hours as needed for nausea/vomiting. 03/01/19   [provider]  oxyCODONE-acetaminophen (PERCOCET) 10-325 MG tablet Take 1 tablet by mouth 4 (four) times daily. 03/01/19   [provider]  predniSONE (DELTASONE) 5 MG tablet Take  5 mg by mouth 2 (two) times daily as needed (hip pain).  03/01/19   [provider]  pregabalin (LYRICA) 100 MG capsule Take 100 mg by mouth 3 (three) times daily. 03/01/19   [provider]  promethazine (PHENERGAN) 25 MG tablet Take 25 mg by mouth every 12 (twelve) hours as needed for nausea/vomiting. 01/11/19   [provider]  traZODone (DESYREL) 150 MG tablet Take 300 mg by mouth at bedtime. 02/23/19   [provider]  Vitamin D, Ergocalciferol, (DRISDOL) 1.25 MG (50000 UT) CAPS capsule Take 50,000 Units by mouth once a  week. Fridays 03/01/19   [provider]     Allergies:    Allergies  Allergen Reactions   Amoxicillin Anaphylaxis   Fluoxetine Hives   Methocarbamol Shortness Of Breath   Penicillins Anaphylaxis and Swelling   Rocephin [Ceftriaxone Sodium In Dextrose] Swelling    Social History:   Social History   Socioeconomic History   Marital status: Single    Spouse name: Not on file   Number of children: Not on file   Years of education: Not on file   Highest education level: Not on file  Occupational History   Not on file  Tobacco Use   Smoking status: Every Day   Smokeless tobacco: Never  Vaping Use   Vaping Use: Never used  Substance and Sexual Activity   Alcohol use: Not Currently   Drug use: No   Sexual activity: Not on file  Other Topics Concern   Not on file  Social History Narrative   Not on file   Social Determinants of Health   Financial Resource Strain: Not on file  Food Insecurity: Not on file  Transportation Needs: Not on file  Physical Activity: Not on file  Stress: Not on file  Social Connections: Not on file  Intimate Partner Violence: Not on file    Family History:   The patient's family history includes Hypertension in her father.    ROS:  Please see the history of present illness.  All other ROS reviewed and negative.     Physical Exam/Data:   Vitals:   10/08/20 1303 10/08/20 1315 10/08/20 1334  BP: 109/66 110/66   Pulse: (!) 58 68   Resp: 14 15   SpO2: 99% 98% 100%   No intake or output data in the 24 hours ending 10/08/20 1340 Last 3 Weights 08/04/2020 03/28/2019 11/07/2017  Weight (lbs) 196 lb 180 lb 172 lb  Weight (kg) 88.905 kg 81.647 kg 78.019 kg     There is no height or weight on file to calculate BMI.  General:  Well nourished, well developed, in no acute distress HEENT: normal Lymph: no adenopathy Neck: no JVD Endocrine:  No thryomegaly Vascular: No carotid bruits; FA pulses 2+ bilaterally without bruits  Cardiac:   normal S1, S2; RRR; no murmur  Lungs:  clear to auscultation bilaterally, no wheezing, rhonchi or rales  Abd: soft, nontender, no hepatomegaly  Ext: no edema Musculoskeletal:  No deformities, BUE and BLE strength normal and equal Skin: warm and dry  Neuro:  CNs 2-12 intact, no focal abnormalities noted Psych:  Normal affect    EKG:  The ECG that was done 7/17 was personally reviewed and demonstrates sinus rhythm, 99 bpm, no acute ST/T wave abnormalities  Relevant CV Studies:  N/a  Laboratory Data:  High Sensitivity Troponin:  No results for input(s): TROPONINIHS in the last 720 hours.    ChemistryNo results for input(s): NA,  K, CL, CO2, GLUCOSE, BUN, CREATININE, CALCIUM, GFRNONAA, GFRAA, ANIONGAP in the last 168 hours.  No results for input(s): PROT, ALBUMIN, AST, ALT, ALKPHOS, BILITOT in the last 168 hours. HematologyNo results for input(s): WBC, RBC, HGB, HCT, MCV, MCH, MCHC, RDW, PLT in the last 168 hours. BNPNo results for input(s): BNP, PROBNP in the last 168 hours.  DDimer No results for input(s): DDIMER in the last 168 hours.   Radiology/Studies:  No results found.   Assessment and Plan:   Breanna Wise is a 45 y.o. female with chronic low back pain, anxiety and tobacco use who is being seen 10/08/2020 for the evaluation of chest pain.  Chest pain: Resented to [facility] with several weeks of symptoms.  Had been previously seen at [facility] with elevated high-sensitivity troponin of 34, with recommendations for stress testing but patient left AMA.  While at [facility] troponin I negative x3.  She was evaluated by Dr. Bettina Gavia with recommendations for definitive cardiac catheterization given elevated troponin at [facility].  -- started on ASA, statin and BB therapy at [facility] -- Further recommendations pending cardiac cath  Hyperlipidemia: Triglycerides 434, LDL unable to calculate, HDL 25 -- [facility]92m [facility]-- further  adjustments pending cath, plan to add statin  Tobacco use: will needs cessation education   Risk Assessment/Risk Scores:   }  HEAR Score (for undifferentiated chest pain):  HEAR Score: 1{   Severity of Illness: The appropriate patient status for this patient is OBSERVATION. Observation status is judged to be reasonable and necessary in order to provide the required intensity of service to ensure the patient's safety. The patient's presenting symptoms, physical exam findings, and initial radiographic and laboratory data in the context of their medical condition is felt to place them at decreased risk for further clinical deterioration. Furthermore, it is anticipated that the patient will be medically stable for discharge from the hospital within 2 midnights of admission. The following factors support the patient status of observation.   " The patient's presenting symptoms include chest pain. " The physical exam findings include stable exam. " The initial radiographic and laboratory data are stable.   For questions or updates, please contact C[place]morePlease consult [email or web address] for contact info under     Signed, Breanna Bellis NP  10/08/2020 1:40 PM   I have examined the patient and reviewed assessment and plan and discussed with patient.  Agree with above as stated.  Patient agreeable to cath.  The patient understands that risks include but are not limited to stroke (1 in 1000), death (1 in 167, kidney failure [usually temporary] (1 in 500), bleeding (1 in 200), allergic reaction [possibly serious] (1 in 200), and agrees to proceed.    Breanna Wise

## 2020-10-09 ENCOUNTER — Encounter (HOSPITAL_COMMUNITY): Payer: Self-pay | Admitting: Interventional Cardiology

## 2021-03-19 ENCOUNTER — Encounter (HOSPITAL_BASED_OUTPATIENT_CLINIC_OR_DEPARTMENT_OTHER): Payer: Self-pay | Admitting: *Deleted

## 2021-03-19 ENCOUNTER — Other Ambulatory Visit: Payer: Self-pay

## 2021-03-19 ENCOUNTER — Emergency Department (HOSPITAL_BASED_OUTPATIENT_CLINIC_OR_DEPARTMENT_OTHER)
Admission: EM | Admit: 2021-03-19 | Discharge: 2021-03-20 | Disposition: A | Discharge status: Home / Self Care | Payer: Medicare Other | Attending: Emergency Medicine | Admitting: Emergency Medicine

## 2021-03-19 DIAGNOSIS — Z5321 Procedure and treatment not carried out due to patient leaving prior to being seen by health care provider: Secondary | ICD-10-CM | POA: Insufficient documentation

## 2021-03-19 DIAGNOSIS — L0231 Cutaneous abscess of buttock: Secondary | ICD-10-CM | POA: Diagnosis not present

## 2021-03-19 DIAGNOSIS — L02412 Cutaneous abscess of left axilla: Secondary | ICD-10-CM | POA: Insufficient documentation

## 2021-03-19 NOTE — ED Triage Notes (Addendum)
C/o left buttocks draining  abscess x 3 days also c/o small abscess to left axilla

## 2022-09-08 ENCOUNTER — Encounter (HOSPITAL_BASED_OUTPATIENT_CLINIC_OR_DEPARTMENT_OTHER): Payer: Self-pay

## 2022-09-08 ENCOUNTER — Emergency Department (HOSPITAL_BASED_OUTPATIENT_CLINIC_OR_DEPARTMENT_OTHER): Payer: 59

## 2022-09-08 ENCOUNTER — Emergency Department (HOSPITAL_BASED_OUTPATIENT_CLINIC_OR_DEPARTMENT_OTHER)
Admission: EM | Admit: 2022-09-08 | Discharge: 2022-09-08 | Disposition: A | Discharge status: Home / Self Care | Payer: 59 | Attending: Emergency Medicine | Admitting: Emergency Medicine

## 2022-09-08 ENCOUNTER — Other Ambulatory Visit: Payer: Self-pay

## 2022-09-08 DIAGNOSIS — M25562 Pain in left knee: Secondary | ICD-10-CM | POA: Diagnosis present

## 2022-09-08 NOTE — ED Notes (Signed)
Pt. Reports on Thursday she injured her L knee.  She heard a pop while walking carrying her grandson.  Pt. Is able to walk with pain.  Pt. Reports she can walk with a limp on her toes with the L leg.

## 2022-09-08 NOTE — Discharge Instructions (Signed)
Your x-ray today did not show fracture or dislocation of the knee.  Please take tylenol/ibuprofen every 6 hours for pain.  Use ice and elevate your leg for symptom relief.  I recommend close follow-up with orthopedics for reevaluation.  Please do not hesitate to return to emergency department if worrisome signs symptoms we discussed become apparent.

## 2022-09-08 NOTE — ED Provider Notes (Signed)
EMERGENCY DEPARTMENT AT [facility] Provider Note   CSN: [id number] Arrival date & time: 09/08/22  4098     History  Chief Complaint  Patient presents with   Knee Pain    Breanna Wise is a 47 y.o. female with a past medical history of scoliosis presents today for evaluation of left knee pain.  Patient states she picked up her grandchild last week and heard a pop in her left knee.  States that her left knee was 3 times swollen than the normal size.  States the swelling has improved since.  Patient has been able to ambulate with some limping.  She denies any fever, calf pain or swelling.  No personal or family history of DVT.  The patient has been trying Advil with minimal relief.   Knee Pain     Past Medical History:  Diagnosis Date   Back pain    Depression    Scoliosis    Past Surgical History:  Procedure Laterality Date   BACK SURGERY     CHOLECYSTECTOMY     LEFT HEART CATH AND CORONARY ANGIOGRAPHY N/A 10/08/2020   Procedure: LEFT HEART CATH AND CORONARY ANGIOGRAPHY;  Surgeon: Corky Crafts, MD;  Location: [facility] INVASIVE CV LAB;  Service: Cardiovascular;  Laterality: N/A;     Home Medications Prior to Admission medications   Medication Sig Start Date End Date Taking? Authorizing Provider  aspirin 325 MG tablet Take 325 mg by mouth daily.    [provider]  clonazePAM (KLONOPIN) 0.5 MG tablet Take 0.5 mg by mouth 2 (two) times daily as needed for anxiety.    [provider]  LINZESS 72 MCG capsule Take 72 mcg by mouth daily as needed. 03/01/19   [provider]  metoCLOPramide (REGLAN) 5 MG tablet Take 10 mg by mouth daily. 03/01/19   [provider]  NARCAN 4 MG/0.1ML LIQD nasal spray kit Place 1 spray into the nose once. Repeat in 3 minutes if no response; call EMS immediately. 01/05/19   [provider]  ondansetron (ZOFRAN) 8 MG tablet Take 8 mg by mouth every 8 (eight) hours as needed for  nausea/vomiting. 03/01/19   [provider]  oxyCODONE-acetaminophen (PERCOCET) 10-325 MG tablet Take 1 tablet by mouth 4 (four) times daily. 03/01/19   [provider]  pregabalin (LYRICA) 100 MG capsule Take 100 mg by mouth 3 (three) times daily. 03/01/19   [provider]  promethazine (PHENERGAN) 25 MG tablet Take 25 mg by mouth every 12 (twelve) hours as needed for nausea/vomiting. 01/11/19   [provider]  traZODone (DESYREL) 50 MG tablet Take 4 tablets (200 mg total) by mouth at bedtime. 10/08/20   Arty Baumgartner, NP  Vitamin D, Ergocalciferol, (DRISDOL) 1.25 MG (50000 UT) CAPS capsule Take 50,000 Units by mouth once a week. Fridays 03/01/19   [provider]      Allergies    Amoxicillin, Buprenorphine, Fluoxetine, Methocarbamol, Penicillins, Baclofen, Rocephin [ceftriaxone sodium in dextrose], Sulfamethoxazole, and Trimethoprim    Review of Systems   Review of Systems Negative except as per HPI.  Physical Exam Updated Vital Signs BP 98/70 (BP Location: Left Arm)   Pulse (!) 106   Temp 98.5 F (36.9 C) (Oral)   Resp 18   Ht 5\' 2"  (1.575 m)   Wt 86.2 kg   SpO2 97%   BMI 34.75 kg/m  Physical Exam Vitals and nursing note reviewed.  Constitutional:  Appearance: Normal appearance.  HENT:     Head: Normocephalic and atraumatic.     Mouth/Throat:     Mouth: Mucous membranes are moist.  Eyes:     General: No scleral icterus. Cardiovascular:     Rate and Rhythm: Normal rate and regular rhythm.     Pulses: Normal pulses.     Heart sounds: Normal heart sounds.  Pulmonary:     Effort: Pulmonary effort is normal.     Breath sounds: Normal breath sounds.  Abdominal:     General: Abdomen is flat.     Palpations: Abdomen is soft.     Tenderness: There is no abdominal tenderness.  Musculoskeletal:        General: No deformity.     Comments: Anterior/posterior/McMurry's tests negative. Patella in place. No instability of knee.  Negative Ballottement test. Neg axial load.  Tenderness to palpation to lateral aspect of the left knee.  Positive Varus test.   Skin:    General: Skin is warm.     Findings: No rash.  Neurological:     General: No focal deficit present.     Mental Status: She is alert.  Psychiatric:        Mood and Affect: Mood normal.     ED Results / Procedures / Treatments   Labs (all labs ordered are listed, but only abnormal results are displayed) Labs Reviewed - No data to display  EKG None  Radiology DG Knee Complete 4 Views Left  Result Date: 09/08/2022 CLINICAL DATA:  Knee pain EXAM: LEFT KNEE - COMPLETE 4 VIEW COMPARISON:  None Available. FINDINGS: No evidence of fracture, dislocation, or joint effusion. No evidence of arthropathy or other focal bone abnormality. Soft tissues are unremarkable. IMPRESSION: No acute osseous abnormality Electronically Signed   By: Karen Kays M.D.   On: 09/08/2022 10:56    Procedures Procedures    Medications Ordered in ED Medications - No data to display  ED Course/ Medical Decision Making/ A&P                             Medical Decision Making Amount and/or Complexity of Data Reviewed Radiology: ordered.   This patient presents to the ED for left knee pain, this involves an extensive number of treatment options, and is a complaint that carries with a high risk of complications and morbidity.  The differential diagnosis includes for sugar, dislocation, ligamentous injury, septic joint.  This is not an exhaustive list.  Imaging studies: I ordered imaging studies. I personally reviewed, interpreted imaging and agree with the radiologist's interpretations. The results include: X-ray of left knee showed no acute osseous abnormalities.  Problem list/ ED course/ Critical interventions/ Medical management: HPI: See above Vital signs within normal range and stable throughout visit. Laboratory/imaging studies significant for: See above. On  physical examination, patient is afebrile and appears in no acute distress.  There was tenderness to palpation to the lateral aspect of the left knee with positive varus test.  Negative posterior/anterior/Maray test.  No axial load.  Based on x-ray and physical examination, I have low suspicion for fracture, dislocation, significant ligamentous injury.  Low suspicion for septic joint, knee joint is not significantly swollen, skin is cool to touch, patient is afebrile, no overlying skin erythema.  Will provide crutches and knee brace.  Advised patient to take Tylenol/ibuprofen for pain, follow-up with orthopedics for reevaluation.  Strict return precaution discussed.  Patient is well  appearing, able to ambulate in the room and is stable for discharge. I have reviewed the patient home medicines and have made adjustments as needed.  Cardiac monitoring/EKG: The patient was maintained on a cardiac monitor.  I personally reviewed and interpreted the cardiac monitor which showed an underlying rhythm of: sinus rhythm.  Additional history obtained: External records from outside source obtained and reviewed including: Chart review including previous notes, labs, imaging.  Consultations obtained:  Disposition Continued outpatient therapy. Follow-up with orthopedics recommended for reevaluation of symptoms. Treatment plan discussed with patient.  Pt acknowledged understanding was agreeable to the plan. Worrisome signs and symptoms were discussed with patient, and patient acknowledged understanding to return to the ED if they noticed these signs and symptoms. Patient was stable upon discharge.   This chart was dictated using voice recognition software.  Despite best efforts to proofread,  errors can occur which can change the documentation meaning.          Final Clinical Impression(s) / ED Diagnoses Final diagnoses:  Acute pain of left knee    Rx / DC Orders ED Discharge Orders     None          Jeanelle Malling, [place] 09/08/22 1115    Arby Barrette, MD 09/13/22 1510

## 2022-09-08 NOTE — ED Triage Notes (Signed)
States picked up her grandchild last week and heard a pop in her left knee. Since then has had swelling and pain. Ambulatory with limp.

## 2022-10-16 ENCOUNTER — Emergency Department (HOSPITAL_BASED_OUTPATIENT_CLINIC_OR_DEPARTMENT_OTHER): Payer: 59

## 2022-10-16 ENCOUNTER — Other Ambulatory Visit: Payer: Self-pay

## 2022-10-16 ENCOUNTER — Emergency Department (HOSPITAL_BASED_OUTPATIENT_CLINIC_OR_DEPARTMENT_OTHER)
Admission: EM | Admit: 2022-10-16 | Discharge: 2022-10-16 | Disposition: A | Discharge status: Home / Self Care | Payer: 59 | Source: Home / Self Care | Attending: Emergency Medicine | Admitting: Emergency Medicine

## 2022-10-16 DIAGNOSIS — S30810A Abrasion of lower back and pelvis, initial encounter: Secondary | ICD-10-CM | POA: Diagnosis not present

## 2022-10-16 DIAGNOSIS — S5002XA Contusion of left elbow, initial encounter: Secondary | ICD-10-CM | POA: Insufficient documentation

## 2022-10-16 DIAGNOSIS — S060X1A Concussion with loss of consciousness of 30 minutes or less, initial encounter: Secondary | ICD-10-CM | POA: Insufficient documentation

## 2022-10-16 DIAGNOSIS — T07XXXA Unspecified multiple injuries, initial encounter: Secondary | ICD-10-CM

## 2022-10-16 DIAGNOSIS — S8002XA Contusion of left knee, initial encounter: Secondary | ICD-10-CM | POA: Diagnosis not present

## 2022-10-16 DIAGNOSIS — Z7982 Long term (current) use of aspirin: Secondary | ICD-10-CM | POA: Insufficient documentation

## 2022-10-16 DIAGNOSIS — S40022A Contusion of left upper arm, initial encounter: Secondary | ICD-10-CM | POA: Insufficient documentation

## 2022-10-16 DIAGNOSIS — M542 Cervicalgia: Secondary | ICD-10-CM | POA: Diagnosis not present

## 2022-10-16 DIAGNOSIS — M7918 Myalgia, other site: Secondary | ICD-10-CM

## 2022-10-16 DIAGNOSIS — R519 Headache, unspecified: Secondary | ICD-10-CM | POA: Diagnosis present

## 2022-10-16 MED ORDER — KETOROLAC TROMETHAMINE 30 MG/ML IJ SOLN
30.0000 mg | Freq: Once | INTRAMUSCULAR | Status: AC
  Administered 2022-10-16: 30 mg via INTRAMUSCULAR
  Filled 2022-10-16: qty 1

## 2022-10-16 MED ORDER — CYCLOBENZAPRINE HCL 10 MG PO TABS: 10.0000 mg | ORAL_TABLET | Freq: Two times a day (BID) | ORAL | 0 refills | Status: AC | PRN

## 2022-10-16 MED ORDER — ONDANSETRON 4 MG PO TBDP: 4.0000 mg | ORAL_TABLET | Freq: Three times a day (TID) | ORAL | 0 refills | Status: AC | PRN

## 2022-10-16 MED ORDER — HYDROCODONE-ACETAMINOPHEN 5-325 MG PO TABS
1.0000 | ORAL_TABLET | Freq: Once | ORAL | Status: AC
  Administered 2022-10-16: 1 via ORAL
  Filled 2022-10-16: qty 1

## 2022-10-16 NOTE — ED Notes (Signed)
Reviewed discharge instructions and recommendations . Questions answered. Pt states understanding. Ambulatory at time of discharge
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
## 2024-03-18 ENCOUNTER — Emergency Department (HOSPITAL_COMMUNITY)
Admission: EM | Admit: 2024-03-18 | Discharge: 2024-03-18 | Discharge status: Against Medical Advice | Attending: Emergency Medicine | Admitting: Emergency Medicine

## 2024-03-18 ENCOUNTER — Emergency Department (HOSPITAL_COMMUNITY)

## 2024-03-18 ENCOUNTER — Other Ambulatory Visit: Payer: Self-pay

## 2024-03-18 DIAGNOSIS — R079 Chest pain, unspecified: Secondary | ICD-10-CM | POA: Insufficient documentation

## 2024-03-18 DIAGNOSIS — Z5321 Procedure and treatment not carried out due to patient leaving prior to being seen by health care provider: Secondary | ICD-10-CM | POA: Diagnosis not present

## 2024-03-18 DIAGNOSIS — R110 Nausea: Secondary | ICD-10-CM | POA: Insufficient documentation

## 2024-03-18 LAB — CBC
HCT: 50 % — ABNORMAL HIGH (ref 36.0–46.0)
Hemoglobin: 16.3 g/dL — ABNORMAL HIGH (ref 12.0–15.0)
MCH: 26.7 pg (ref 26.0–34.0)
MCHC: 32.6 g/dL (ref 30.0–36.0)
MCV: 82 fL (ref 80.0–100.0)
Platelets: 170 K/uL (ref 150–400)
RBC: 6.1 MIL/uL — ABNORMAL HIGH (ref 3.87–5.11)
RDW: 18.2 % — ABNORMAL HIGH (ref 11.5–15.5)
WBC: 9.8 K/uL (ref 4.0–10.5)
nRBC: 0 % (ref 0.0–0.2)

## 2024-03-18 LAB — BASIC METABOLIC PANEL WITH GFR
Anion gap: 12 (ref 5–15)
BUN: 7 mg/dL (ref 6–20)
CO2: 21 mmol/L — ABNORMAL LOW (ref 22–32)
Calcium: 9.6 mg/dL (ref 8.9–10.3)
Chloride: 105 mmol/L (ref 98–111)
Creatinine, Ser: 0.75 mg/dL (ref 0.44–1.00)
GFR, Estimated: >60 mL/min
Glucose, Bld: 93 mg/dL (ref 70–99)
Potassium: 4 mmol/L (ref 3.5–5.1)
Sodium: 138 mmol/L (ref 135–145)

## 2024-03-18 LAB — TROPONIN T, HIGH SENSITIVITY: Troponin T High Sensitivity: 16 ng/L (ref 0–19)

## 2024-03-18 LAB — HCG, SERUM, QUALITATIVE: Preg, Serum: NEGATIVE

## 2024-03-18 NOTE — ED Triage Notes (Signed)
 Pt BIB [facility] from home for intermittent CP x 2 days. Starrts in posterior shoulders and back radiating to chest.  Hx of afib, in afib now at 105ish.  Pt took 1 nitroglycerin without relief. EMS gave 324 ASA. Pt has also had nausea. 4mg  Zofran  given. 20G L. FA.   118/70 HR 100-105, RR 18 98% RA, CBG 107
